# Patient Record
Sex: Female | Born: 1985 | Race: White | Hispanic: No | Marital: Married | Smoking: Never smoker
Health system: Southern US, Community
[De-identification: ages and names within clinical notes are randomized; demographics above are authoritative.]

## PROBLEM LIST (undated history)

## (undated) DIAGNOSIS — J45909 Unspecified asthma, uncomplicated: Secondary | ICD-10-CM

## (undated) DIAGNOSIS — K759 Inflammatory liver disease, unspecified: Secondary | ICD-10-CM

## (undated) DIAGNOSIS — F32A Depression, unspecified: Secondary | ICD-10-CM

## (undated) DIAGNOSIS — F419 Anxiety disorder, unspecified: Secondary | ICD-10-CM

## (undated) DIAGNOSIS — R87629 Unspecified abnormal cytological findings in specimens from vagina: Secondary | ICD-10-CM

## (undated) HISTORY — DX: Unspecified asthma, uncomplicated: J45.909

## (undated) HISTORY — DX: Anxiety disorder, unspecified: F41.9

## (undated) HISTORY — DX: Unspecified abnormal cytological findings in specimens from vagina: R87.629

## (undated) HISTORY — PX: WISDOM TOOTH EXTRACTION: SHX21

## (undated) HISTORY — DX: Inflammatory liver disease, unspecified: K75.9

---

## 2016-04-19 ENCOUNTER — Other Ambulatory Visit (HOSPITAL_COMMUNITY): Payer: Self-pay | Admitting: Nurse Practitioner

## 2016-04-19 DIAGNOSIS — B182 Chronic viral hepatitis C: Secondary | ICD-10-CM

## 2016-05-08 ENCOUNTER — Ambulatory Visit (HOSPITAL_COMMUNITY)
Admission: RE | Admit: 2016-05-08 | Discharge: 2016-05-08 | Disposition: A | Discharge status: Home / Self Care | Payer: BC Managed Care – PPO | Source: Ambulatory Visit | Attending: Nurse Practitioner | Admitting: Nurse Practitioner

## 2016-05-08 DIAGNOSIS — K824 Cholesterolosis of gallbladder: Secondary | ICD-10-CM | POA: Diagnosis not present

## 2016-05-08 DIAGNOSIS — B182 Chronic viral hepatitis C: Secondary | ICD-10-CM | POA: Diagnosis present

## 2016-09-25 ENCOUNTER — Other Ambulatory Visit (HOSPITAL_COMMUNITY): Payer: Self-pay | Admitting: Nurse Practitioner

## 2016-09-25 DIAGNOSIS — B182 Chronic viral hepatitis C: Secondary | ICD-10-CM

## 2016-12-11 ENCOUNTER — Ambulatory Visit (HOSPITAL_COMMUNITY)
Admission: RE | Admit: 2016-12-11 | Discharge: 2016-12-11 | Disposition: A | Discharge status: Home / Self Care | Payer: BC Managed Care – PPO | Source: Ambulatory Visit | Attending: Nurse Practitioner | Admitting: Nurse Practitioner

## 2016-12-11 DIAGNOSIS — B182 Chronic viral hepatitis C: Secondary | ICD-10-CM | POA: Diagnosis not present

## 2017-09-24 ENCOUNTER — Other Ambulatory Visit: Payer: Self-pay | Admitting: Nurse Practitioner

## 2017-09-24 DIAGNOSIS — B1920 Unspecified viral hepatitis C without hepatic coma: Secondary | ICD-10-CM

## 2017-10-28 LAB — OB RESULTS CONSOLE HIV ANTIBODY (ROUTINE TESTING): HIV: NONREACTIVE

## 2017-10-28 LAB — OB RESULTS CONSOLE GC/CHLAMYDIA
Chlamydia: NEGATIVE
Gonorrhea: NEGATIVE

## 2017-10-28 LAB — OB RESULTS CONSOLE HEPATITIS B SURFACE ANTIGEN: Hepatitis B Surface Ag: NEGATIVE

## 2017-10-28 LAB — OB RESULTS CONSOLE RUBELLA ANTIBODY, IGM: Rubella: IMMUNE

## 2017-10-28 LAB — OB RESULTS CONSOLE ANTIBODY SCREEN: Antibody Screen: NEGATIVE

## 2017-10-28 LAB — OB RESULTS CONSOLE ABO/RH: RH Type: POSITIVE

## 2017-10-28 LAB — OB RESULTS CONSOLE RPR: RPR: NONREACTIVE

## 2018-05-06 ENCOUNTER — Encounter (HOSPITAL_COMMUNITY): Payer: Self-pay | Admitting: *Deleted

## 2018-05-07 ENCOUNTER — Telehealth (HOSPITAL_COMMUNITY): Payer: Self-pay | Admitting: *Deleted

## 2018-05-07 ENCOUNTER — Encounter (HOSPITAL_COMMUNITY): Payer: Self-pay

## 2018-05-07 NOTE — Telephone Encounter (Signed)
Preadmission screen  

## 2018-05-07 NOTE — Patient Instructions (Signed)
Lalena Stoltzfoos  05/07/2018   Your procedure is scheduled on:  05/19/2018  Arrive at 0830 at [employer] [facility] on [address] at [facility] and [employer]. You are invited to use the FREE valet parking or use the Visitor's parking deck.  Pick up the phone at the desk and dial [phone].  Call this number if you have problems the morning of surgery: [phone]  Remember:   Do not eat food:(After Midnight) Desps de medianoche.  Do not drink clear liquids: (After Midnight) Desps de medianoche.  Take these medicines the morning of surgery with A SIP OF WATER:  none   Do not wear jewelry, make-up or nail polish.  Do not wear lotions, powders, or perfumes. Do not wear deodorant.  Do not shave 48 hours prior to surgery.  Do not bring valuables to the hospital.  [facility] is not   responsible for any belongings or valuables brought to the hospital.  Contacts, dentures or bridgework may not be worn into surgery.  Leave suitcase in the car. After surgery it may be brought to your room.  For patients admitted to the hospital, checkout time is 11:00 AM the day of              discharge.      Please read over the following fact sheets that you were given:     Preparing for Surgery

## 2018-05-13 ENCOUNTER — Other Ambulatory Visit: Payer: Self-pay | Admitting: Obstetrics and Gynecology

## 2018-05-13 NOTE — H&P (Signed)
33 y.o.  G3P1010 [redacted]w[redacted]d comes in for a repeat cesarean section at term.  Patient has good fetal movement and no bleeding.    No past medical history on file.  Past Surgical History:  Procedure Laterality Date  . CESAREAN SECTION    . WISDOM TOOTH EXTRACTION      OB History  Gravida Para Term Preterm AB Living  3 1 1   1     SAB TAB Ectopic Multiple Live Births  1            # Outcome Date GA Lbr Len/2nd Weight Sex Delivery Anes PTL Lv  3 Current           2 Term 2017 [redacted]w[redacted]d   M CS-LTranv     1 SAB             Social History   Socioeconomic History  . Marital status: Married    Spouse name: Not on file  . Number of children: Not on file  . Years of education: Not on file  . Highest education level: Not on file  Occupational History  . Not on file  Social Needs  . Financial resource strain: Not on file  . Food insecurity:    Worry: Not on file    Inability: Not on file  . Transportation needs:    Medical: Not on file    Non-medical: Not on file  Tobacco Use  . Smoking status: Not on file  Substance and Sexual Activity  . Alcohol use: Not on file  . Drug use: Not on file  . Sexual activity: Not on file  Lifestyle  . Physical activity:    Days per week: Not on file    Minutes per session: Not on file  . Stress: Not on file  Relationships  . Social connections:    Talks on phone: Not on file    Gets together: Not on file    Attends religious service: Not on file    Active member of club or organization: Not on file    Attends meetings of clubs or organizations: Not on file    Relationship status: Not on file  . Intimate partner violence:    Fear of current or ex partner: Not on file    Emotionally abused: Not on file    Physically abused: Not on file    Forced sexual activity: Not on file  Other Topics Concern  . Not on file  Social History Narrative  . Not on file   Patient has no known allergies.   Prenatal Course: uncomplicated.  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   Prenatal Transfer Tool  Maternal Diabetes: No Genetic Screening: Normal Maternal Ultrasounds/Referrals: Normal Fetal Ultrasounds or other Referrals:  None Maternal Substance Abuse:  No Significant Maternal Medications:  None Significant Maternal Lab Results: None  There were no vitals filed for this visit.  Lungs/Cor:  NAD Abdomen:  soft, gravid Ex:  no cords, erythema SVE:  NA FHTs:  present  A/P   For repeat cesarean sectionat term.  All risks, benefits and alternatives discussed with patient and she desires to proceed.  Loney Laurence

## 2018-05-18 ENCOUNTER — Encounter (HOSPITAL_COMMUNITY)
Admission: RE | Admit: 2018-05-18 | Discharge: 2018-05-18 | Disposition: A | Discharge status: Home / Self Care | Payer: BC Managed Care – PPO | Source: Ambulatory Visit

## 2018-05-19 ENCOUNTER — Encounter (HOSPITAL_COMMUNITY): Payer: Self-pay | Admitting: *Deleted

## 2018-05-19 ENCOUNTER — Other Ambulatory Visit: Payer: Self-pay

## 2018-05-19 ENCOUNTER — Inpatient Hospital Stay (HOSPITAL_COMMUNITY)
Admission: AD | Admit: 2018-05-19 | Discharge: 2018-05-21 | DRG: 788 | Disposition: A | Discharge status: Home / Self Care | Payer: BC Managed Care – PPO | Source: Intra-hospital | Attending: Obstetrics and Gynecology | Admitting: Obstetrics and Gynecology

## 2018-05-19 ENCOUNTER — Inpatient Hospital Stay (HOSPITAL_COMMUNITY)
Admission: RE | Admit: 2018-05-19 | Payer: BC Managed Care – PPO | Source: Home / Self Care | Admitting: Obstetrics and Gynecology

## 2018-05-19 ENCOUNTER — Inpatient Hospital Stay (HOSPITAL_COMMUNITY): Payer: BC Managed Care – PPO | Admitting: Certified Registered Nurse Anesthetist

## 2018-05-19 ENCOUNTER — Encounter (HOSPITAL_COMMUNITY)
Admission: AD | Disposition: A | Discharge status: Home / Self Care | Payer: Self-pay | Attending: Obstetrics and Gynecology

## 2018-05-19 DIAGNOSIS — M25511 Pain in right shoulder: Secondary | ICD-10-CM | POA: Diagnosis not present

## 2018-05-19 DIAGNOSIS — Z3A38 38 weeks gestation of pregnancy: Secondary | ICD-10-CM | POA: Diagnosis not present

## 2018-05-19 DIAGNOSIS — O9089 Other complications of the puerperium, not elsewhere classified: Secondary | ICD-10-CM | POA: Diagnosis not present

## 2018-05-19 DIAGNOSIS — O34211 Maternal care for low transverse scar from previous cesarean delivery: Principal | ICD-10-CM | POA: Diagnosis present

## 2018-05-19 LAB — CBC WITH DIFFERENTIAL/PLATELET
Abs Immature Granulocytes: 0.2 10*3/uL — ABNORMAL HIGH (ref 0.00–0.07)
Basophils Absolute: 0 10*3/uL (ref 0.0–0.1)
Basophils Relative: 0 %
Eosinophils Absolute: 0.1 10*3/uL (ref 0.0–0.5)
Eosinophils Relative: 1 %
HCT: 41.5 % (ref 36.0–46.0)
Hemoglobin: 14.1 g/dL (ref 12.0–15.0)
Immature Granulocytes: 2 %
Lymphocytes Relative: 17 %
Lymphs Abs: 1.7 10*3/uL (ref 0.7–4.0)
MCH: 31.5 pg (ref 26.0–34.0)
MCHC: 34 g/dL (ref 30.0–36.0)
MCV: 92.8 fL (ref 80.0–100.0)
Monocytes Absolute: 0.7 10*3/uL (ref 0.1–1.0)
Monocytes Relative: 7 %
Neutro Abs: 7.2 10*3/uL (ref 1.7–7.7)
Neutrophils Relative %: 73 %
Platelets: 204 10*3/uL (ref 150–400)
RBC: 4.47 MIL/uL (ref 3.87–5.11)
RDW: 13.2 % (ref 11.5–15.5)
WBC: 9.9 10*3/uL (ref 4.0–10.5)
nRBC: 0 % (ref 0.0–0.2)

## 2018-05-19 LAB — SYPHILIS: RPR W/REFLEX TO RPR TITER AND TREPONEMAL ANTIBODIES, TRADITIONAL SCREENING AND DIAGNOSIS ALGORITHM: RPR Ser Ql: NONREACTIVE

## 2018-05-19 LAB — TYPE AND SCREEN
ABO/RH(D): A POS
Antibody Screen: NEGATIVE

## 2018-05-19 SURGERY — Surgical Case
Anesthesia: Epidural | Site: Abdomen | Wound class: Clean Contaminated

## 2018-05-19 MED ORDER — NALBUPHINE HCL 10 MG/ML IJ SOLN: 5.0000 mg | INTRAMUSCULAR | Status: DC | PRN

## 2018-05-19 MED ORDER — EPHEDRINE SULFATE-NACL 50-0.9 MG/10ML-% IV SOSY
PREFILLED_SYRINGE | INTRAVENOUS | Status: DC | PRN
  Administered 2018-05-19: 10 mg via INTRAVENOUS

## 2018-05-19 MED ORDER — NALBUPHINE HCL 10 MG/ML IJ SOLN
5.0000 mg | INTRAMUSCULAR | Status: DC | PRN
  Administered 2018-05-19 (×2): 5 mg via INTRAVENOUS
  Filled 2018-05-19 (×2): qty 1

## 2018-05-19 MED ORDER — WITCH HAZEL-GLYCERIN EX PADS: 1.0000 "application " | MEDICATED_PAD | CUTANEOUS | Status: DC | PRN

## 2018-05-19 MED ORDER — DIPHENHYDRAMINE HCL 25 MG PO CAPS: 25.0000 mg | ORAL_CAPSULE | Freq: Four times a day (QID) | ORAL | Status: DC | PRN

## 2018-05-19 MED ORDER — FERROUS SULFATE 325 (65 FE) MG PO TABS
325.0000 mg | ORAL_TABLET | Freq: Two times a day (BID) | ORAL | Status: DC
  Administered 2018-05-19 – 2018-05-21 (×4): 325 mg via ORAL
  Filled 2018-05-19 (×4): qty 1

## 2018-05-19 MED ORDER — NALBUPHINE HCL 10 MG/ML IJ SOLN: 5.0000 mg | Freq: Once | INTRAMUSCULAR | Status: DC | PRN

## 2018-05-19 MED ORDER — ALBUTEROL SULFATE (2.5 MG/3ML) 0.083% IN NEBU: 2.5000 mg | INHALATION_SOLUTION | Freq: Four times a day (QID) | RESPIRATORY_TRACT | Status: DC | PRN

## 2018-05-19 MED ORDER — MENTHOL 3 MG MT LOZG: 1.0000 | LOZENGE | OROMUCOSAL | Status: DC | PRN

## 2018-05-19 MED ORDER — MORPHINE SULFATE (PF) 0.5 MG/ML IJ SOLN
INTRAMUSCULAR | Status: DC | PRN
  Administered 2018-05-19: 150 ug via INTRATHECAL

## 2018-05-19 MED ORDER — KETOROLAC TROMETHAMINE 30 MG/ML IJ SOLN
30.0000 mg | Freq: Once | INTRAMUSCULAR | Status: AC
  Administered 2018-05-19: 12:00:00 30 mg via INTRAVENOUS

## 2018-05-19 MED ORDER — METHYLERGONOVINE MALEATE 0.2 MG/ML IJ SOLN: 0.2000 mg | INTRAMUSCULAR | Status: DC | PRN

## 2018-05-19 MED ORDER — PHENYLEPHRINE HCL-NACL 20-0.9 MG/250ML-% IV SOLN
INTRAVENOUS | Status: AC
  Filled 2018-05-19: qty 250

## 2018-05-19 MED ORDER — SOD CITRATE-CITRIC ACID 500-334 MG/5ML PO SOLN: 30.0000 mL | ORAL | Status: DC

## 2018-05-19 MED ORDER — SODIUM CHLORIDE 0.9 % IR SOLN
Status: DC | PRN
  Administered 2018-05-19: 1

## 2018-05-19 MED ORDER — ONDANSETRON HCL 4 MG/2ML IJ SOLN: 4.0000 mg | Freq: Once | INTRAMUSCULAR | Status: DC | PRN

## 2018-05-19 MED ORDER — DIPHENHYDRAMINE HCL 50 MG/ML IJ SOLN
12.5000 mg | INTRAMUSCULAR | Status: DC | PRN
  Administered 2018-05-19: 13:00:00 12.5 mg via INTRAVENOUS

## 2018-05-19 MED ORDER — SODIUM CHLORIDE 0.9% FLUSH: 3.0000 mL | INTRAVENOUS | Status: DC | PRN

## 2018-05-19 MED ORDER — MORPHINE SULFATE (PF) 0.5 MG/ML IJ SOLN
INTRAMUSCULAR | Status: AC
  Filled 2018-05-19: qty 10

## 2018-05-19 MED ORDER — DIPHENHYDRAMINE HCL 50 MG/ML IJ SOLN
INTRAMUSCULAR | Status: AC
  Filled 2018-05-19: qty 1

## 2018-05-19 MED ORDER — ONDANSETRON HCL 4 MG/2ML IJ SOLN
INTRAMUSCULAR | Status: AC
  Filled 2018-05-19: qty 2

## 2018-05-19 MED ORDER — COCONUT OIL OIL: 1.0000 "application " | TOPICAL_OIL | Status: DC | PRN

## 2018-05-19 MED ORDER — MEPERIDINE HCL 25 MG/ML IJ SOLN: 6.2500 mg | INTRAMUSCULAR | Status: DC | PRN

## 2018-05-19 MED ORDER — OXYCODONE-ACETAMINOPHEN 5-325 MG PO TABS
1.0000 | ORAL_TABLET | ORAL | Status: DC | PRN
  Administered 2018-05-20 (×3): 1 via ORAL
  Filled 2018-05-19 (×4): qty 1

## 2018-05-19 MED ORDER — IBUPROFEN 800 MG PO TABS
800.0000 mg | ORAL_TABLET | Freq: Three times a day (TID) | ORAL | Status: DC
  Administered 2018-05-19 – 2018-05-21 (×7): 800 mg via ORAL
  Filled 2018-05-19 (×7): qty 1

## 2018-05-19 MED ORDER — LACTATED RINGERS IV SOLN
INTRAVENOUS | Status: DC
  Administered 2018-05-19 – 2018-05-20 (×2): via INTRAVENOUS

## 2018-05-19 MED ORDER — LACTATED RINGERS IV SOLN
INTRAVENOUS | Status: DC
  Administered 2018-05-19 (×3): via INTRAVENOUS

## 2018-05-19 MED ORDER — LACTATED RINGERS IV SOLN: INTRAVENOUS | Status: DC

## 2018-05-19 MED ORDER — SOD CITRATE-CITRIC ACID 500-334 MG/5ML PO SOLN
ORAL | Status: AC
  Filled 2018-05-19: qty 30

## 2018-05-19 MED ORDER — BUPIVACAINE IN DEXTROSE 0.75-8.25 % IT SOLN
INTRATHECAL | Status: DC | PRN
  Administered 2018-05-19: 1.6 mL via INTRATHECAL

## 2018-05-19 MED ORDER — TETANUS-DIPHTH-ACELL PERTUSSIS 5-2.5-18.5 LF-MCG/0.5 IM SUSP: 0.5000 mL | Freq: Once | INTRAMUSCULAR | Status: DC

## 2018-05-19 MED ORDER — ZOLPIDEM TARTRATE 5 MG PO TABS: 5.0000 mg | ORAL_TABLET | Freq: Every evening | ORAL | Status: DC | PRN

## 2018-05-19 MED ORDER — SCOPOLAMINE 1 MG/3DAYS TD PT72
MEDICATED_PATCH | TRANSDERMAL | Status: AC
  Filled 2018-05-19: qty 1

## 2018-05-19 MED ORDER — ACETAMINOPHEN 500 MG PO TABS: 1000.0000 mg | ORAL_TABLET | ORAL | Status: DC

## 2018-05-19 MED ORDER — OXYTOCIN 40 UNITS IN NORMAL SALINE INFUSION - SIMPLE MED: 2.5000 [IU]/h | INTRAVENOUS | Status: AC

## 2018-05-19 MED ORDER — SCOPOLAMINE 1 MG/3DAYS TD PT72
1.0000 | MEDICATED_PATCH | Freq: Once | TRANSDERMAL | Status: DC
  Administered 2018-05-19: 1.5 mg via TRANSDERMAL

## 2018-05-19 MED ORDER — FENTANYL CITRATE (PF) 100 MCG/2ML IJ SOLN
INTRAMUSCULAR | Status: DC | PRN
  Administered 2018-05-19: 15 ug via INTRATHECAL

## 2018-05-19 MED ORDER — CEFAZOLIN SODIUM-DEXTROSE 2-4 GM/100ML-% IV SOLN
INTRAVENOUS | Status: AC
  Filled 2018-05-19: qty 100

## 2018-05-19 MED ORDER — SODIUM CHLORIDE 0.9 % IV SOLN
INTRAVENOUS | Status: DC | PRN
  Administered 2018-05-19: 60 ug/min via INTRAVENOUS

## 2018-05-19 MED ORDER — METHYLERGONOVINE MALEATE 0.2 MG PO TABS: 0.2000 mg | ORAL_TABLET | ORAL | Status: DC | PRN

## 2018-05-19 MED ORDER — STERILE WATER FOR IRRIGATION IR SOLN
Status: DC | PRN
  Administered 2018-05-19: 1000 mL

## 2018-05-19 MED ORDER — OXYTOCIN 40 UNITS IN NORMAL SALINE INFUSION - SIMPLE MED
INTRAVENOUS | Status: AC
  Filled 2018-05-19: qty 1000

## 2018-05-19 MED ORDER — NALOXONE HCL 0.4 MG/ML IJ SOLN: 0.4000 mg | INTRAMUSCULAR | Status: DC | PRN

## 2018-05-19 MED ORDER — KETOROLAC TROMETHAMINE 30 MG/ML IJ SOLN
INTRAMUSCULAR | Status: AC
  Filled 2018-05-19: qty 1

## 2018-05-19 MED ORDER — OXYTOCIN 40 UNITS IN NORMAL SALINE INFUSION - SIMPLE MED
INTRAVENOUS | Status: DC | PRN
  Administered 2018-05-19: 40 mL via INTRAVENOUS

## 2018-05-19 MED ORDER — NALOXONE HCL 4 MG/10ML IJ SOLN
1.0000 ug/kg/h | INTRAVENOUS | Status: DC | PRN
  Filled 2018-05-19: qty 5

## 2018-05-19 MED ORDER — CEFAZOLIN SODIUM-DEXTROSE 2-4 GM/100ML-% IV SOLN: 2.0000 g | INTRAVENOUS | Status: DC

## 2018-05-19 MED ORDER — FENTANYL CITRATE (PF) 100 MCG/2ML IJ SOLN
INTRAMUSCULAR | Status: AC
  Filled 2018-05-19: qty 2

## 2018-05-19 MED ORDER — FLEET ENEMA 7-19 GM/118ML RE ENEM: 1.0000 | ENEMA | Freq: Every day | RECTAL | Status: DC | PRN

## 2018-05-19 MED ORDER — ONDANSETRON HCL 4 MG/2ML IJ SOLN
INTRAMUSCULAR | Status: DC | PRN
  Administered 2018-05-19: 4 mg via INTRAVENOUS

## 2018-05-19 MED ORDER — SENNOSIDES-DOCUSATE SODIUM 8.6-50 MG PO TABS
2.0000 | ORAL_TABLET | ORAL | Status: DC
  Administered 2018-05-19: 22:00:00 2 via ORAL
  Filled 2018-05-19: qty 2

## 2018-05-19 MED ORDER — OXYCODONE HCL 5 MG/5ML PO SOLN: 5.0000 mg | Freq: Once | ORAL | Status: DC | PRN

## 2018-05-19 MED ORDER — OXYCODONE HCL 5 MG PO TABS: 5.0000 mg | ORAL_TABLET | Freq: Once | ORAL | Status: DC | PRN

## 2018-05-19 MED ORDER — PRENATAL MULTIVITAMIN CH
1.0000 | ORAL_TABLET | Freq: Every day | ORAL | Status: DC
  Administered 2018-05-20: 23:00:00 1 via ORAL
  Filled 2018-05-19 (×2): qty 1

## 2018-05-19 MED ORDER — DIPHENHYDRAMINE HCL 25 MG PO CAPS: 25.0000 mg | ORAL_CAPSULE | ORAL | Status: DC | PRN

## 2018-05-19 MED ORDER — MEASLES, MUMPS & RUBELLA VAC IJ SOLR: 0.5000 mL | Freq: Once | INTRAMUSCULAR | Status: DC

## 2018-05-19 MED ORDER — DIBUCAINE (PERIANAL) 1 % EX OINT: 1.0000 "application " | TOPICAL_OINTMENT | CUTANEOUS | Status: DC | PRN

## 2018-05-19 MED ORDER — SIMETHICONE 80 MG PO CHEW
80.0000 mg | CHEWABLE_TABLET | Freq: Three times a day (TID) | ORAL | Status: DC
  Administered 2018-05-19 – 2018-05-21 (×6): 80 mg via ORAL
  Filled 2018-05-19 (×6): qty 1

## 2018-05-19 MED ORDER — ACETAMINOPHEN 500 MG PO TABS
1000.0000 mg | ORAL_TABLET | ORAL | Status: AC
  Administered 2018-05-19: 10:00:00 1000 mg via ORAL

## 2018-05-19 MED ORDER — FENTANYL CITRATE (PF) 100 MCG/2ML IJ SOLN: 25.0000 ug | INTRAMUSCULAR | Status: DC | PRN

## 2018-05-19 MED ORDER — BISACODYL 10 MG RE SUPP: 10.0000 mg | Freq: Every day | RECTAL | Status: DC | PRN

## 2018-05-19 MED ORDER — ONDANSETRON HCL 4 MG/2ML IJ SOLN: 4.0000 mg | Freq: Three times a day (TID) | INTRAMUSCULAR | Status: DC | PRN

## 2018-05-19 MED ORDER — SIMETHICONE 80 MG PO CHEW: 80.0000 mg | CHEWABLE_TABLET | ORAL | Status: DC | PRN

## 2018-05-19 MED ORDER — EPHEDRINE 5 MG/ML INJ
INTRAVENOUS | Status: AC
  Filled 2018-05-19: qty 10

## 2018-05-19 MED ORDER — SODIUM CHLORIDE 0.9 % IV SOLN
INTRAVENOUS | Status: DC | PRN
  Administered 2018-05-19: 11:00:00 via INTRAVENOUS

## 2018-05-19 MED ORDER — SIMETHICONE 80 MG PO CHEW
80.0000 mg | CHEWABLE_TABLET | ORAL | Status: DC
  Administered 2018-05-19 – 2018-05-20 (×2): 80 mg via ORAL
  Filled 2018-05-19 (×2): qty 1

## 2018-05-19 MED ORDER — CEFAZOLIN SODIUM-DEXTROSE 2-4 GM/100ML-% IV SOLN
2.0000 g | Freq: Once | INTRAVENOUS | Status: AC
  Administered 2018-05-19: 2 g via INTRAVENOUS

## 2018-05-19 MED ORDER — ACETAMINOPHEN 500 MG PO TABS
ORAL_TABLET | ORAL | Status: AC
  Filled 2018-05-19: qty 2

## 2018-05-19 MED ORDER — SOD CITRATE-CITRIC ACID 500-334 MG/5ML PO SOLN
30.0000 mL | Freq: Once | ORAL | Status: AC
  Administered 2018-05-19: 30 mL via ORAL

## 2018-05-19 SURGICAL SUPPLY — 33 items
BENZOIN TINCTURE PRP APPL 2/3 (GAUZE/BANDAGES/DRESSINGS) ×3 IMPLANT
CHLORAPREP W/TINT 26ML (MISCELLANEOUS) ×3 IMPLANT
CLAMP CORD UMBIL (MISCELLANEOUS) IMPLANT
CLEANER TIP ELECTROSURG 2X2 (MISCELLANEOUS) ×3 IMPLANT
CLOSURE WOUND 1/2 X4 (GAUZE/BANDAGES/DRESSINGS) ×1
CLOTH BEACON ORANGE TIMEOUT ST (SAFETY) ×3 IMPLANT
DRSG OPSITE POSTOP 4X10 (GAUZE/BANDAGES/DRESSINGS) ×3 IMPLANT
ELECT REM PT RETURN 9FT ADLT (ELECTROSURGICAL) ×3
ELECTRODE REM PT RTRN 9FT ADLT (ELECTROSURGICAL) ×1 IMPLANT
EXTRACTOR VACUUM BELL STYLE (SUCTIONS) IMPLANT
GLOVE BIO SURGEON STRL SZ7 (GLOVE) ×3 IMPLANT
GLOVE BIOGEL PI IND STRL 7.0 (GLOVE) ×1 IMPLANT
GLOVE BIOGEL PI INDICATOR 7.0 (GLOVE) ×2
GOWN STRL REUS W/TWL LRG LVL3 (GOWN DISPOSABLE) ×6 IMPLANT
KIT ABG SYR 3ML LUER SLIP (SYRINGE) IMPLANT
NEEDLE HYPO 25X5/8 SAFETYGLIDE (NEEDLE) IMPLANT
NS IRRIG 1000ML POUR BTL (IV SOLUTION) ×3 IMPLANT
PACK C SECTION WH (CUSTOM PROCEDURE TRAY) ×3 IMPLANT
PAD OB MATERNITY 4.3X12.25 (PERSONAL CARE ITEMS) ×3 IMPLANT
PENCIL SMOKE EVAC W/HOLSTER (ELECTROSURGICAL) ×3 IMPLANT
RTRCTR C-SECT PINK 25CM LRG (MISCELLANEOUS) ×3 IMPLANT
STRIP CLOSURE SKIN 1/2X4 (GAUZE/BANDAGES/DRESSINGS) ×2 IMPLANT
SUT MNCRL 0 VIOLET CTX 36 (SUTURE) ×2 IMPLANT
SUT MONOCRYL 0 CTX 36 (SUTURE) ×4
SUT PLAIN 2 0 XLH (SUTURE) IMPLANT
SUT VIC AB 0 CT1 27 (SUTURE) ×6
SUT VIC AB 0 CT1 27XBRD ANBCTR (SUTURE) ×3 IMPLANT
SUT VIC AB 2-0 CT1 27 (SUTURE) ×2
SUT VIC AB 2-0 CT1 TAPERPNT 27 (SUTURE) ×1 IMPLANT
SUT VIC AB 4-0 KS 27 (SUTURE) ×3 IMPLANT
TOWEL OR 17X24 6PK STRL BLUE (TOWEL DISPOSABLE) ×3 IMPLANT
TRAY FOLEY W/BAG SLVR 14FR LF (SET/KITS/TRAYS/PACK) ×3 IMPLANT
WATER STERILE IRR 1000ML POUR (IV SOLUTION) ×3 IMPLANT

## 2018-05-19 NOTE — Op Note (Signed)
PATIENT:  Yesenia Mora  33 y.o. female  PRE-OPERATIVE DIAGNOSIS:  REPEAT EDD: 05/25/18  POST-OPERATIVE DIAGNOSIS:  REPEAT EDD: 05/25/18  PROCEDURE:  Procedure(s): CESAREAN SECTION (N/A)  SURGEON:  Surgeon(s) and Role:    Carrington Clamp, MD - Primary  ASSISTANTS: RNFA   ANESTHESIA:   spinal  EBL:  70 mL   SPECIMEN:  No Specimen  DISPOSITION OF SPECIMEN:  N/A  COUNTS:  YES  TOURNIQUET:  * No tourniquets in log *  DICTATION: .Note written in EPIC  PLAN OF CARE: Admit to inpatient   PATIENT DISPOSITION:  PACU - hemodynamically stable.   Delay start of Pharmacological VTE agent (>24hrs) due to surgical blood loss or risk of bleeding: not applicable  Complications:  None  Medications:  Ancef, Pitocin Findings:  Baby female, Apgars 8,9, weight P.   Normal tubes, ovaries and uterus seen.  Baby was skin to skin with mother after birth in the OR.  Technique:  After adequate spinal anesthesia was achieved, the patient was prepped and draped in usual sterile fashion.  A foley catheter was used to drain the bladder.  A pfannanstiel incision was made with the scalpel and carried down to the fascia with the bovie cautery. The fascia was incised in the midline with the scalpel and carried in a transverse curvilinear manner bilaterally.  The fascia was reflected superiorly and inferiorly off the rectus muscles and the muscles split in the midline.  A bowel free portion of the peritoneum was entered bluntly and then extended in a superior and inferior manner with good visualization of the bowel and bladder.  The Alexis instrument was then placed and the vesico-uterine fascia tented up and incised in a transverse curvilinear manner.  A 2 cm transverse incision was made in the upper portion of the lower uterine segment until the amnion was exposed.   The incision was extended transversely in a blunt manner.  Clear fluid was noted and the baby delivered in the vertex presentation without  complication.  The baby was bulb suctioned and the cord was clamped and cut aftet stripping blood from cord into baby.  The baby was then handed to awaiting Neonatology.  The placenta was then delivered manually and the uterus cleared of all debris.  The uterine incision was then closed with a running lock stitch of 0 monocryl.  An imbricating layer of 0 monocryl was closed as well. Excellent hemostasis of the uterine incision was achieved and the abdomen was cleared with irrigation.  The peritoneum was closed with a running stitch of 2-0 vicryl.  This incorporated the rectus muscles as a separate layer.  The fascia was then closed with a running stitch of 0 vicryl.  The subcutaneous layer was closed with interrupted  stitches of 2-0 plain gut.  The skin was closed with 4-0 vicryl on a Keith needle and steri-strips.  The patient tolerated the procedure well and was returned to the recovery room in stable condition.  All counts were correct times three.  Loney Laurence

## 2018-05-19 NOTE — Lactation Note (Signed)
This note was copied from a baby's chart. Lactation Consultation Note  Patient Name: Yesenia Mora YSHUO'H Date: 05/19/2018 Reason for consult: Initial assessment;Term  2 hours old FT female who is being exclusively BF by his mother, she's a P2 and experienced BF. She was able to BF her first child for 6 weeks and the reason why she had to stop early is because baby had acid reflux. Mom is familiar with hand expression, and already able to see some drops of colostrum when doing so. She has a Medela DEBP at home.  Offered assistance with latch but mom politely declined, she said baby just finished a 45 minutes feeding, he's been on and off the breast almost the entire time since he was born. Mom was very sleepy, asked her RN Dot Lanes to hold off admission paperwork until mom wakes up from her nap. Reviewed feeding cues, cluster feeding and normal newborn behavior with dad mostly, mom was falling asleep by then.  Feeding plan:  1. Encouraged mom to keep feeding baby 8-12 times/24 hours or sooner if feeding cues are present 2. Hand expression and spoon feeding were also encouraged  BF brochure, BF resources and feeding diary were reviewed. Parents reported all questions and concerns were answered, they're both aware of LC services and will call PRN.  Maternal Data Formula Feeding for Exclusion: No Has patient been taught Hand Expression?: Yes Does the patient have breastfeeding experience prior to this delivery?: Yes  Feeding Feeding Type: Breast Fed  LATCH Score Latch: Grasps breast easily, tongue down, lips flanged, rhythmical sucking.  Audible Swallowing: A few with stimulation  Type of Nipple: Everted at rest and after stimulation  Comfort (Breast/Nipple): Soft / non-tender  Hold (Positioning): Assistance needed to correctly position infant at breast and maintain latch.  LATCH Score: 8  Interventions Interventions: Breast feeding basics reviewed  Lactation Tools  Discussed/Used WIC Program: No   Consult Status Consult Status: Follow-up Date: 05/20/18 Follow-up type: In-patient    Yesenia Mora Yesenia Mora 05/19/2018, 2:03 PM

## 2018-05-19 NOTE — Anesthesia Preprocedure Evaluation (Signed)
Anesthesia Evaluation  Patient identified by MRN, date of birth, ID band Patient awake    Reviewed: Allergy & Precautions, H&P , NPO status , Patient's Chart, lab work & pertinent test results  History of Anesthesia Complications Negative for: history of anesthetic complications  Airway Mallampati: II  TM Distance: >3 FB Neck ROM: full    Dental no notable dental hx.    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) ,    Pulmonary exam normal        Cardiovascular negative cardio ROS Normal cardiovascular exam Rhythm:regular Rate:Normal     Neuro/Psych negative neurological ROS  negative psych ROS   GI/Hepatic negative GI ROS, Neg liver ROS,   Endo/Other  negative endocrine ROS  Renal/GU negative Renal ROS  negative genitourinary   Musculoskeletal   Abdominal   Peds  Hematology negative hematology ROS (+)   Anesthesia Other Findings   Reproductive/Obstetrics (+) Pregnancy                             Anesthesia Physical Anesthesia Plan  ASA: II  Anesthesia Plan: Epidural   Post-op Pain Management:    Induction:   PONV Risk Score and Plan:   Airway Management Planned:   Additional Equipment:   Intra-op Plan:   Post-operative Plan:   Informed Consent: I have reviewed the patients History and Physical, chart, labs and discussed the procedure including the risks, benefits and alternatives for the proposed anesthesia with the patient or authorized representative who has indicated his/her understanding and acceptance.       Plan Discussed with:   Anesthesia Plan Comments:         Anesthesia Quick Evaluation

## 2018-05-19 NOTE — Transfer of Care (Signed)
Immediate Anesthesia Transfer of Care Note  Patient: Yesenia Mora  Procedure(s) Performed: CESAREAN SECTION (N/A Abdomen)  Patient Location: PACU  Anesthesia Type:Spinal  Level of Consciousness: awake, alert  and oriented  Airway & Oxygen Therapy: Patient Spontanous Breathing  Post-op Assessment: Report given to RN and Post -op Vital signs reviewed and stable  Post vital signs: Reviewed and stable  Last Vitals:  Vitals Value Taken Time  BP 125/66 05/19/2018 11:52 AM  Temp    Pulse 87 05/19/2018 11:53 AM  Resp 20 05/19/2018 11:53 AM  SpO2 98 % 05/19/2018 11:53 AM  Vitals shown include unvalidated device data.  Last Pain:  Vitals:   05/19/18 0837  TempSrc: Oral         Complications: No apparent anesthesia complications

## 2018-05-19 NOTE — Progress Notes (Signed)
There has been no change in the patients history, status or exam since the history and physical.  Vitals:   05/19/18 0837  BP: 111/74  Pulse: 86  Resp: 18  Temp: 97.8 F (36.6 C)  TempSrc: Oral  SpO2: 100%  Weight: 69.4 kg  Height: 5\' 2"  (1.575 m)    Results for orders placed or performed during the hospital encounter of 05/19/18 (from the past 72 hour(s))  Type and screen [facility]     Status: None   Collection Time: 05/19/18  8:40 AM  Result Value Ref Range   ABO/RH(D) A POS    Antibody Screen NEG    Sample Expiration      05/22/2018 Performed at [facility], [address], [place], [place] [address]   CBC with Differential/Platelet     Status: Abnormal   Collection Time: 05/19/18  8:47 AM  Result Value Ref Range   WBC 9.9 4.0 - 10.5 K/uL   RBC 4.47 3.87 - 5.11 MIL/uL   Hemoglobin 14.1 12.0 - 15.0 g/dL   HCT 23.5 57.3 - 22.0 %   MCV 92.8 80.0 - 100.0 fL   MCH 31.5 26.0 - 34.0 pg   MCHC 34.0 30.0 - 36.0 g/dL   RDW 25.4 27.0 - 62.3 %   Platelets 204 150 - 400 K/uL   nRBC 0.0 0.0 - 0.2 %   Neutrophils Relative % 73 %   Neutro Abs 7.2 1.7 - 7.7 K/uL   Lymphocytes Relative 17 %   Lymphs Abs 1.7 0.7 - 4.0 K/uL   Monocytes Relative 7 %   Monocytes Absolute 0.7 0.1 - 1.0 K/uL   Eosinophils Relative 1 %   Eosinophils Absolute 0.1 0.0 - 0.5 K/uL   Basophils Relative 0 %   Basophils Absolute 0.0 0.0 - 0.1 K/uL   Immature Granulocytes 2 %   Abs Immature Granulocytes 0.20 (H) 0.00 - 0.07 K/uL    Comment: Performed at [facility], [address] [place], [place] [address]    Loney Laurence

## 2018-05-19 NOTE — Anesthesia Procedure Notes (Signed)
Spinal  Patient location during procedure: OR Staffing Anesthesiologist: Tabitha Tupper E, MD Performed: anesthesiologist  Preanesthetic Checklist Completed: patient identified, surgical consent, pre-op evaluation, timeout performed, IV checked, risks and benefits discussed and monitors and equipment checked Spinal Block Patient position: sitting Prep: site prepped and draped and DuraPrep Patient monitoring: continuous pulse ox, blood pressure and heart rate Approach: midline Location: L3-4 Injection technique: single-shot Needle Needle type: Pencan  Needle gauge: 24 G Needle length: 9 cm Additional Notes Functioning IV was confirmed and monitors were applied. Sterile prep and drape, including hand hygiene and sterile gloves were used. The patient was positioned and the spine was prepped. The skin was anesthetized with lidocaine.  Free flow of clear CSF was obtained prior to injecting local anesthetic into the CSF. The needle was carefully withdrawn. The patient tolerated the procedure well.      

## 2018-05-19 NOTE — Brief Op Note (Signed)
05/19/2018  11:38 AM  PATIENT:  Yesenia Mora  33 y.o. female  PRE-OPERATIVE DIAGNOSIS:  REPEAT EDD: 05/25/18  POST-OPERATIVE DIAGNOSIS:  REPEAT EDD: 05/25/18  PROCEDURE:  Procedure(s): CESAREAN SECTION (N/A)  SURGEON:  Surgeon(s) and Role:    Carrington Clamp, MD - Primary  ASSISTANTS: RNFA   ANESTHESIA:   spinal  EBL:  70 mL   SPECIMEN:  No Specimen  DISPOSITION OF SPECIMEN:  N/A  COUNTS:  YES  TOURNIQUET:  * No tourniquets in log *  DICTATION: .Note written in EPIC  PLAN OF CARE: Admit to inpatient   PATIENT DISPOSITION:  PACU - hemodynamically stable.   Delay start of Pharmacological VTE agent (>24hrs) due to surgical blood loss or risk of bleeding: not applicable

## 2018-05-20 LAB — CBC
HCT: 37.4 % (ref 36.0–46.0)
Hemoglobin: 12.6 g/dL (ref 12.0–15.0)
MCH: 31.4 pg (ref 26.0–34.0)
MCHC: 33.7 g/dL (ref 30.0–36.0)
MCV: 93.3 fL (ref 80.0–100.0)
Platelets: 176 10*3/uL (ref 150–400)
RBC: 4.01 MIL/uL (ref 3.87–5.11)
RDW: 13.3 % (ref 11.5–15.5)
WBC: 11.6 10*3/uL — ABNORMAL HIGH (ref 4.0–10.5)
nRBC: 0 % (ref 0.0–0.2)

## 2018-05-20 LAB — ABO/RH: ABO/RH(D): A POS

## 2018-05-20 MED ORDER — PNEUMOCOCCAL VAC POLYVALENT 25 MCG/0.5ML IJ INJ: 0.5000 mL | INJECTION | Freq: Once | INTRAMUSCULAR | Status: DC

## 2018-05-20 NOTE — Lactation Note (Signed)
This note was copied from a baby's chart. Lactation Consultation Note  Patient Name: Yesenia Mora VOPFY'T Date: 05/20/2018 Reason for consult: Follow-up assessment;Term  F/U with P2 mom, baby is now 20 hours old. Mom is experienced with breastfeeding and reports feeding is going well. Mom denies any pain/discomfort or difficulty with latching. Mom states baby has been a little sleepy today since circumcision.  Baby sleeping soundly in bassinet, mom states she will feed him soon.  Reviewed hand expression and breast massage prior to latching baby.  Reviewed expected output of baby and normal baby feeding behaviors.  Reviewed alternating breasts each feeding and utilizing various feeding positions.  Maternal Data Has patient been taught Hand Expression?: Yes(demonstrated) Does the patient have breastfeeding experience prior to this delivery?: Yes   Interventions Interventions: Breast massage;Hand express;Position options  Consult Status Consult Status: Follow-up Date: 05/21/18 Follow-up type: In-patient    Virgia Land 05/20/2018, 6:03 PM

## 2018-05-20 NOTE — Anesthesia Postprocedure Evaluation (Signed)
Anesthesia Post Note  Patient: Chartered certified accountant  Procedure(s) Performed: CESAREAN SECTION (N/A Abdomen)     Patient location during evaluation: PACU Anesthesia Type: Epidural Level of consciousness: oriented and awake and alert Pain management: pain level controlled Vital Signs Assessment: post-procedure vital signs reviewed and stable Respiratory status: spontaneous breathing, respiratory function stable and nonlabored ventilation Cardiovascular status: blood pressure returned to baseline and stable Postop Assessment: no headache, no backache, no apparent nausea or vomiting and epidural receding Anesthetic complications: no    Last Vitals:  Vitals:   05/20/18 1022 05/20/18 1314  BP: (!) 98/50 (!) 102/56  Pulse: 63 73  Resp: 20 19  Temp: 36.8 C (!) 36.3 C  SpO2:  98%    Last Pain:  Vitals:   05/20/18 1600  TempSrc:   PainSc: 5    Pain Goal: Patients Stated Pain Goal: 4 (05/19/18 1825)                 Lucretia Kern

## 2018-05-20 NOTE — Progress Notes (Signed)
Patient is eating, ambulating, voiding.  Pain control is good.  Appropriate lochia, no complaints but some right shoulder pain.  Vitals:   05/19/18 2219 05/20/18 0230 05/20/18 0520 05/20/18 1022  BP: (!) 90/52 (!) 92/49 (!) 97/58 (!) 98/50  Pulse: 64 (!) 59 65 63  Resp: 16 18 18 20   Temp: 97.8 F (36.6 C) 97.6 F (36.4 C) 97.6 F (36.4 C) 98.3 F (36.8 C)  TempSrc: Oral Oral Oral Axillary  SpO2: 96%     Weight:      Height:        Fundus firm, non-tender Inc: c/d/i Ext: no calf tenderness   Lab Results  Component Value Date   WBC 11.6 (H) 05/20/2018   HGB 12.6 05/20/2018   HCT 37.4 05/20/2018   MCV 93.3 05/20/2018   PLT 176 05/20/2018    --/--/A POS, A POS (04/14 0840)  A/P Post op day #1 s/p repeat c/s circ desired, consent obtained  Routine care.  Expect d/c 4/16.    Philip Aspen

## 2018-05-21 ENCOUNTER — Encounter (HOSPITAL_COMMUNITY): Payer: Self-pay | Admitting: *Deleted

## 2018-05-21 MED ORDER — OXYCODONE-ACETAMINOPHEN 5-325 MG PO TABS: 1.0000 | ORAL_TABLET | ORAL | 0 refills | Status: DC | PRN

## 2018-05-21 NOTE — Progress Notes (Signed)
  Patient is eating, ambulating, voiding.  Pain control is good.  Vitals:   05/20/18 0520 05/20/18 1022 05/20/18 1314 05/21/18 0603  BP: (!) 97/58 (!) 98/50 (!) 102/56 108/60  Pulse: 65 63 73 67  Resp: 18 20 19 18   Temp: 97.6 F (36.4 C) 98.3 F (36.8 C) (!) 97.3 F (36.3 C) 98.5 F (36.9 C)  TempSrc: Oral Axillary Oral Oral  SpO2:   98%   Weight:      Height:        lungs:   clear to auscultation cor:    RRR Abdomen:  soft, appropriate tenderness, incisions intact and without erythema or exudate ex:    no cords   Lab Results  Component Value Date   WBC 11.6 (H) 05/20/2018   HGB 12.6 05/20/2018   HCT 37.4 05/20/2018   MCV 93.3 05/20/2018   PLT 176 05/20/2018    --/--/A POS, A POS (04/14 0840)/RI  A/P    Post operative day 2.  Routine post op and postpartum care.  Expect d/c today.  Percocet for pain control.

## 2018-05-21 NOTE — Discharge Summary (Signed)
Obstetric Discharge Summary Reason for Admission: cesarean section Prenatal Procedures: none Intrapartum Procedures: cesarean: low cervical, transverse Postpartum Procedures: none Complications-Operative and Postpartum: none Hemoglobin  Date Value Ref Range Status  05/20/2018 12.6 12.0 - 15.0 g/dL Final   HCT  Date Value Ref Range Status  05/20/2018 37.4 36.0 - 46.0 % Final    Discharge Diagnoses: Term Pregnancy-delivered  Discharge Information: Date: 05/21/2018 Activity: pelvic rest Diet: routine Medications: Percocet Condition: stable Instructions: refer to practice specific booklet Discharge to: home Follow-up Information    Carrington Clamp, MD Follow up in 4 week(s).   Specialty:  Obstetrics and Gynecology Why:  May be telehealth if still under restrictions. Contact information: [address] [place] [address] [phone]           Newborn Data: Live born female  Birth Weight: 7 lb 2.1 oz (3235 g) APGAR: 8, 9  Newborn Delivery   Birth date/time:  05/19/2018 11:09:00 Delivery type:  C-Section, Low Transverse Trial of labor:  No C-section categorization:  Repeat     Home with mother.  Loney Laurence 05/21/2018, 8:22 AM

## 2018-05-21 NOTE — Lactation Note (Signed)
This note was copied from a baby's chart. Lactation Consultation Note  Patient Name: Yesenia Mora'O Date: 05/21/2018 Reason for consult: Follow-up assessment;Term Baby is 48 hours old/7% weight loss.  Mom reports baby is feeding well.  Her breasts are feeling heavy.  Discussed milk coming to volume and the prevention and treatment of engorgement.  Mom has a pump at home.  Lactation outpatient services and support reviewed and encouraged.  Maternal Data    Feeding Feeding Type: Breast Fed  LATCH Score                   Interventions    Lactation Tools Discussed/Used     Consult Status Consult Status: Complete Follow-up type: Call as needed    Huston Foley 05/21/2018, 11:39 AM

## 2018-05-22 IMAGING — US US ABDOMEN COMPLETE W/ ELASTOGRAPHY
1 series · 13 of 15 positions shown · non-contrast
Comparison: None.

CLINICAL DATA: Colonic hepatitis-C without hepatic coma.



[Series 1: us abdomen complete w/ elastography · 0.14mm/px · 13 of 15 slices shown]
[im 1/15]
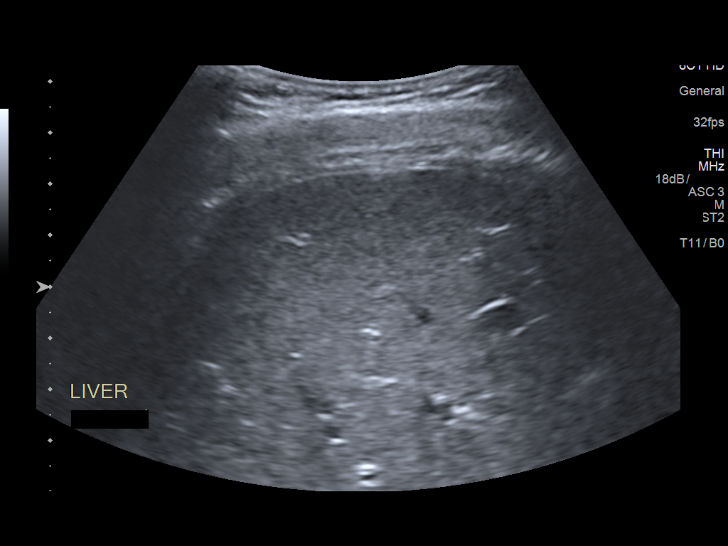
[im 2/15]
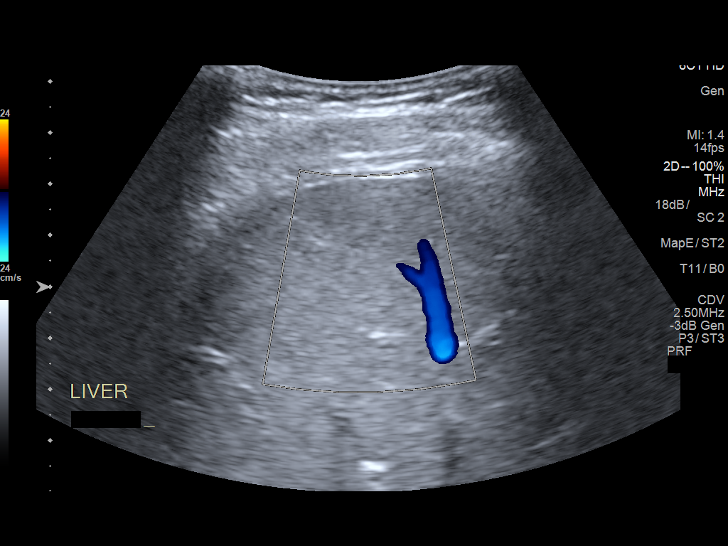
[im 3/15]
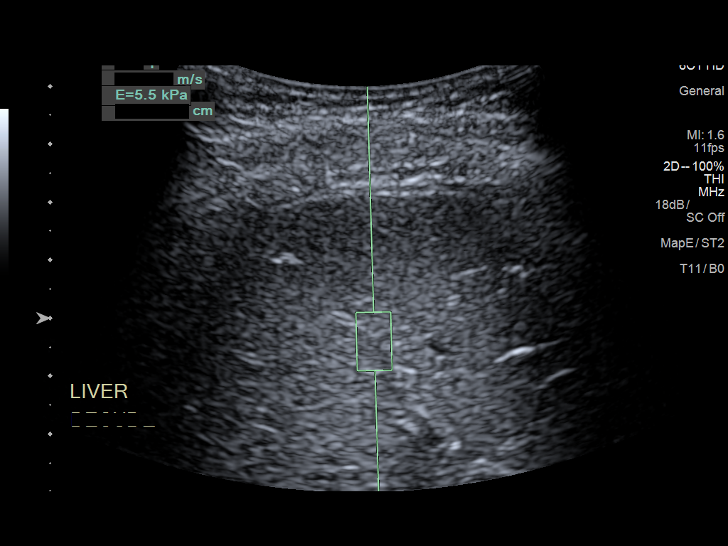
[im 5/15]
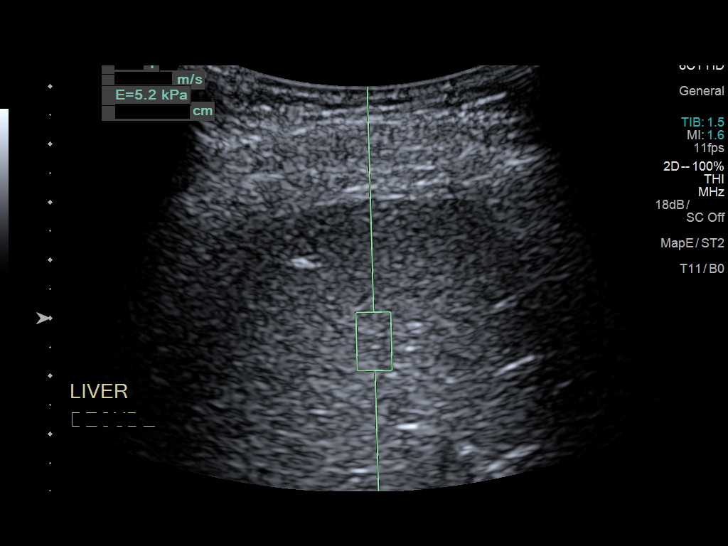
[im 6/15]
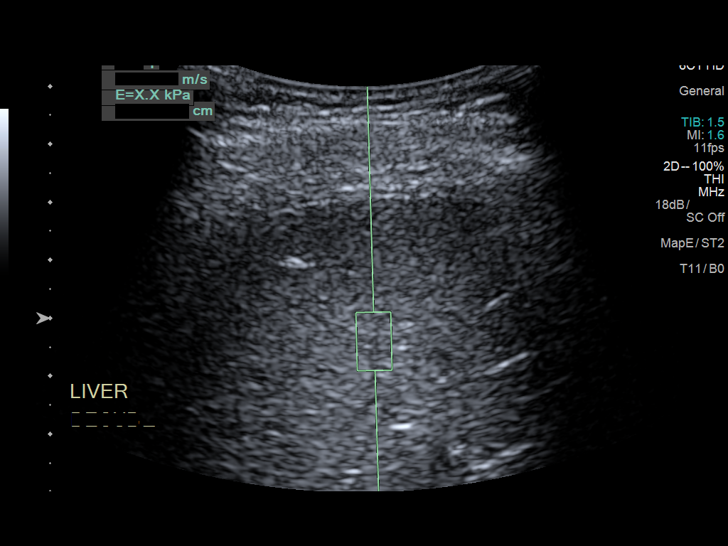
[im 7/15]
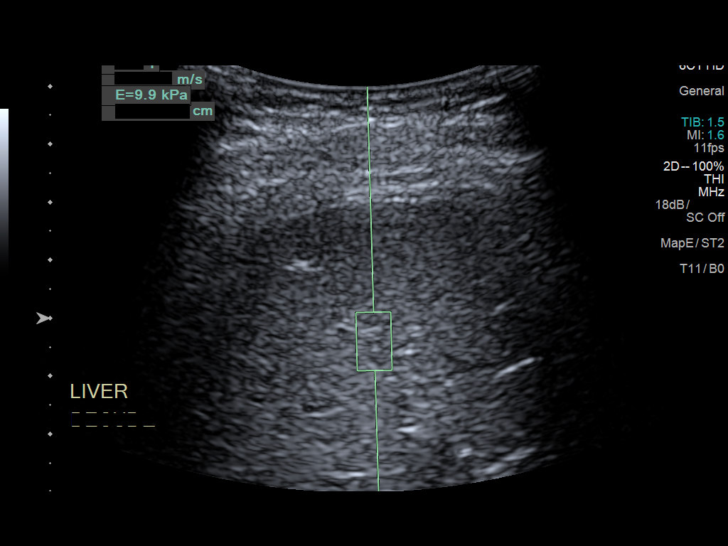
[im 8/15]
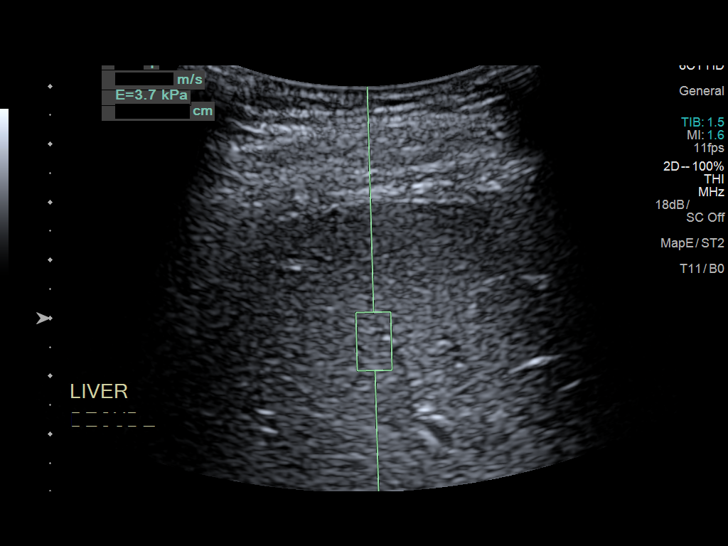
[im 9/15]
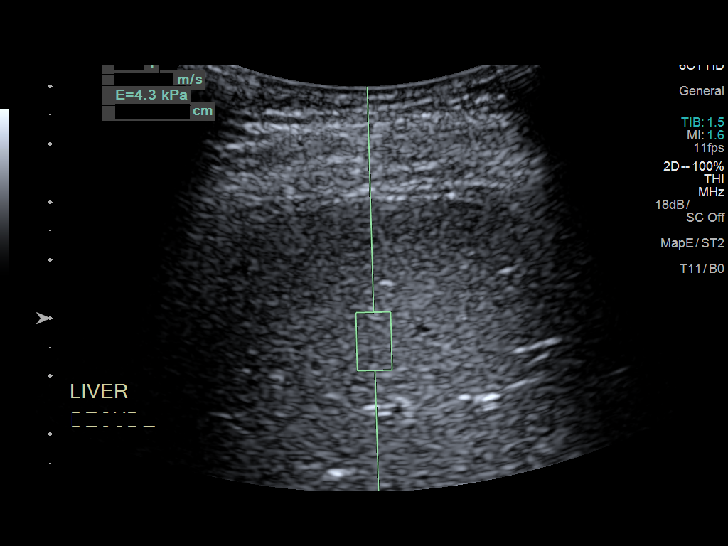
[im 10/15]
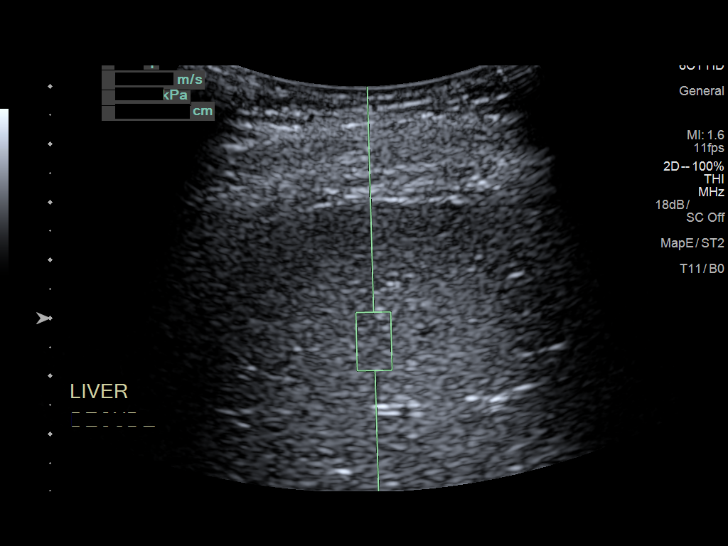
[im 11/15]
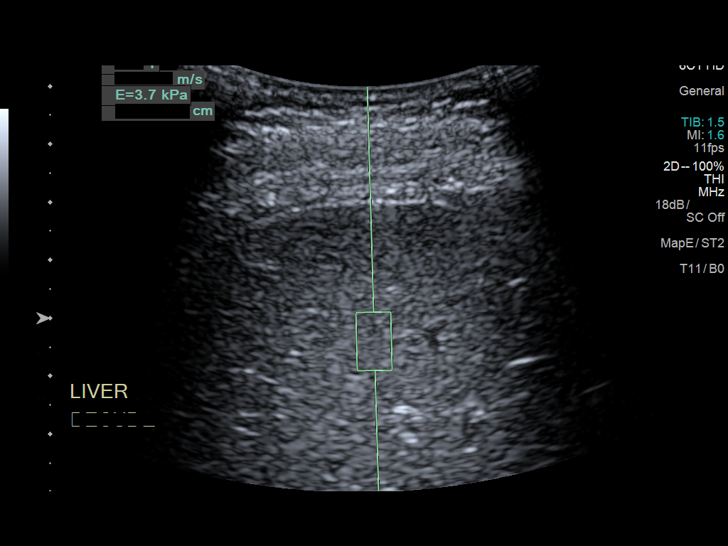
[im 13/15]
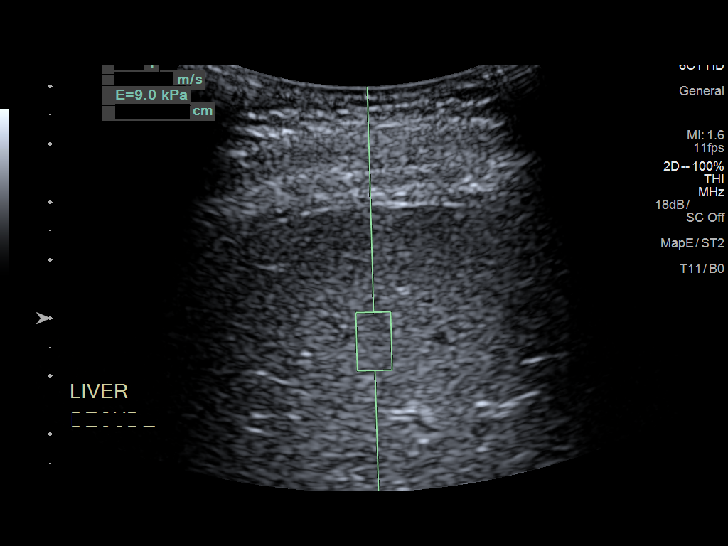
[im 14/15]
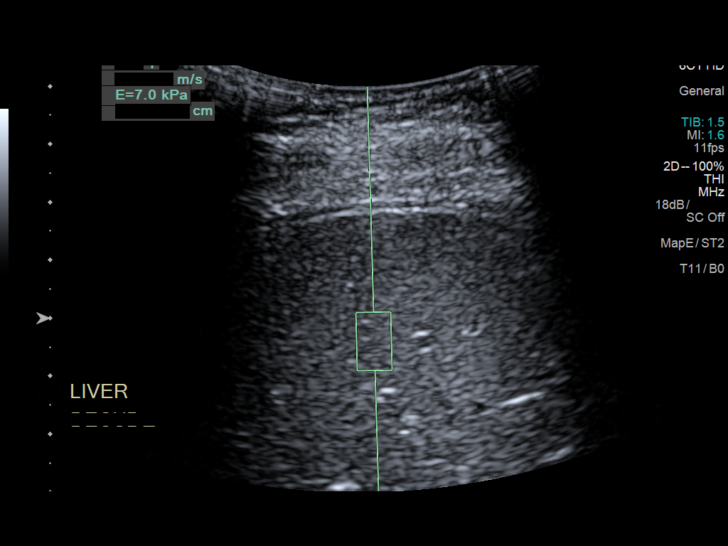
[im 15/15]
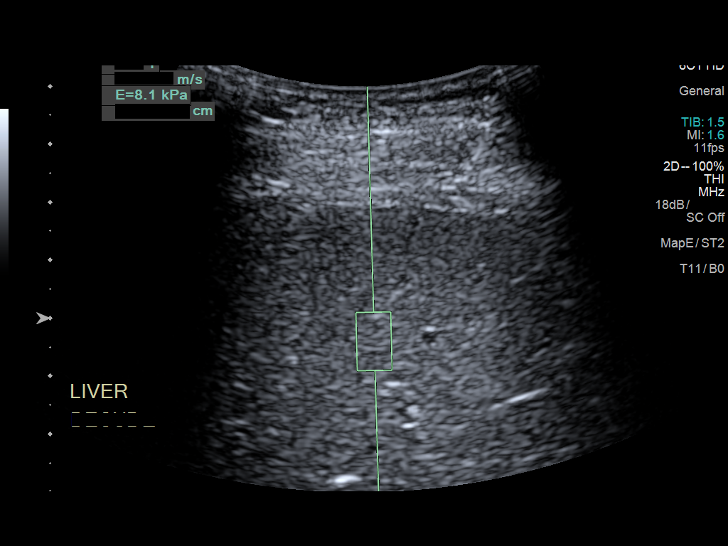

[13 of 15 positions shown; findings below may reference images not displayed]

FINDINGS: ULTRASOUND ABDOMEN

Gallbladder: No gallstones or wall thickening visualized. No
sonographic Murphy sign noted by sonographer. A tiny 3 mm non mobile
gallbladder polyp is noted.

Common bile duct: Diameter: 3 mm, within normal limits.

Liver: No focal lesion identified. Within normal limits in
parenchymal echogenicity.

IVC: No abnormality visualized.

Pancreas: Visualized portion unremarkable.

Spleen: Size and appearance within normal limits.

Right Kidney: Length: 10.2 cm. Echogenicity within normal limits. No
mass or hydronephrosis visualized.

Left Kidney: Length: 11.2 cm. Echogenicity within normal limits. No
mass or hydronephrosis visualized.

Abdominal aorta: No aneurysm visualized.

Other findings: None.

ULTRASOUND HEPATIC ELASTOGRAPHY

Device: Siemens Helix VTQ

Patient position: Left Lateral Decubitus

Transducer 6C1

Number of measurements: 10

Hepatic segment:  8

Median velocity:   1.54  m/sec

IQR:

IQR/Median velocity ratio:

Corresponding Metavir fibrosis score:  F2 + some F3

Risk of fibrosis: Moderate

Limitations of exam: None

Pertinent findings noted on other imaging exams:  None

Please note that abnormal shear wave velocities may also be
identified in clinical settings other than with hepatic fibrosis,
such as: acute hepatitis, elevated right heart and central venous
pressures including use of beta blockers, Fieschi disease
(Es), infiltrative processes such as
mastocytosis/amyloidosis/infiltrative tumor, extrahepatic
cholestasis, in the post-prandial state, and liver transplantation.
Correlation with patient history, laboratory data, and clinical
condition recommended.
IMPRESSION: ULTRASOUND ABDOMEN:
Tiny 3 mm gallbladder polyp noted. No definite gallstones or biliary
ductal dilatation.

Unremarkable sonographic appearance of liver. No liver mass
identified.

ULTRASOUND HEPATIC ELASTOGRAPHY:

Median hepatic shear wave velocity is calculated at 1.54 m/sec.

Corresponding Metavir fibrosis score is  F2 + some F3.

Risk of fibrosis is Moderate.

Follow-up: Additional testing appropriate

## 2018-08-21 ENCOUNTER — Other Ambulatory Visit: Payer: Self-pay | Admitting: Nurse Practitioner

## 2018-08-21 DIAGNOSIS — B1920 Unspecified viral hepatitis C without hepatic coma: Secondary | ICD-10-CM

## 2018-09-03 ENCOUNTER — Other Ambulatory Visit: Payer: BC Managed Care – PPO

## 2018-09-11 ENCOUNTER — Ambulatory Visit
Admission: RE | Admit: 2018-09-11 | Discharge: 2018-09-11 | Disposition: A | Discharge status: Home / Self Care | Payer: BC Managed Care – PPO | Source: Ambulatory Visit | Attending: Nurse Practitioner | Admitting: Nurse Practitioner

## 2018-09-11 DIAGNOSIS — B1920 Unspecified viral hepatitis C without hepatic coma: Secondary | ICD-10-CM

## 2018-12-25 IMAGING — US US ABDOMEN COMPLETE W/ ELASTOGRAPHY
1 series · 13 of 25 positions shown · non-contrast
Comparison: Ultrasound 05/08/2016

CLINICAL DATA: Chronic hepatitis-C.



[Series 1: us abdomen complete w/ elastography · 0.15mm/px · 13 of 84 slices shown]
[im 1/84]
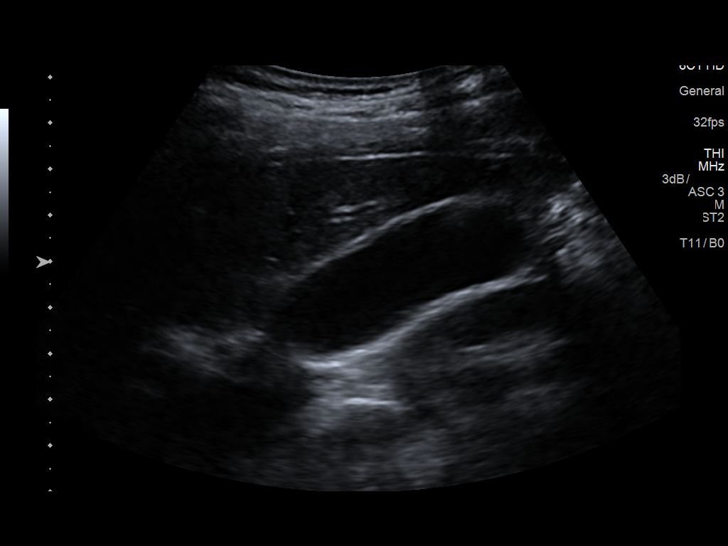
[im 7/84]
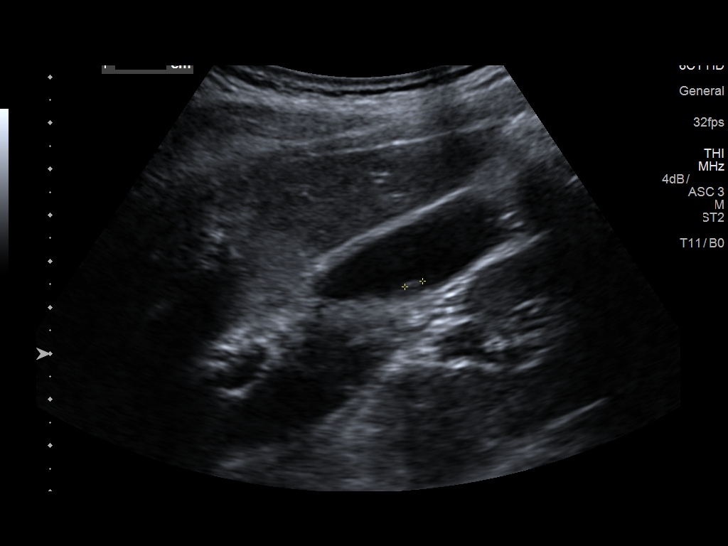
[im 14/84]
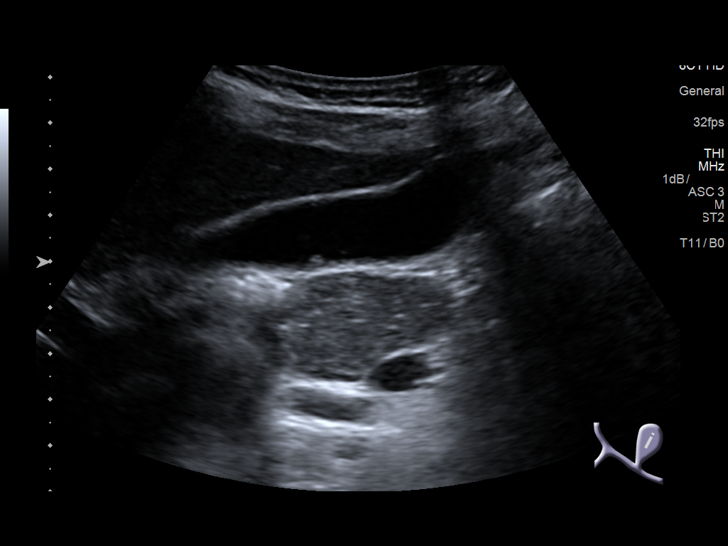
[im 21/84]
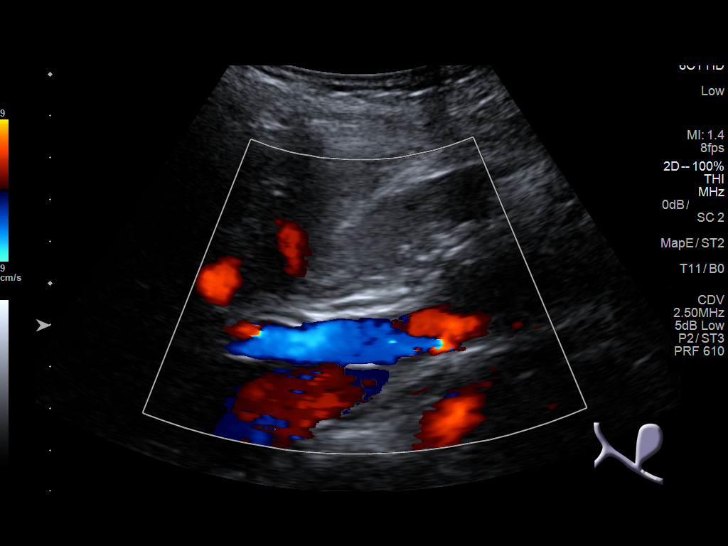
[im 28/84]
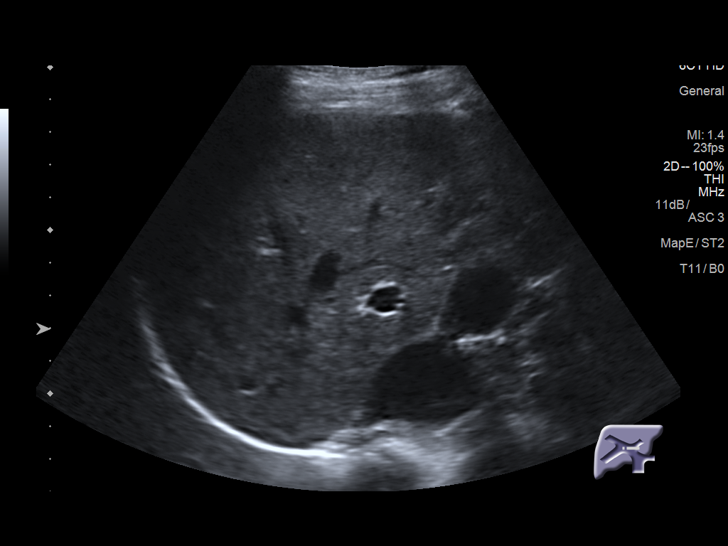
[im 35/84]
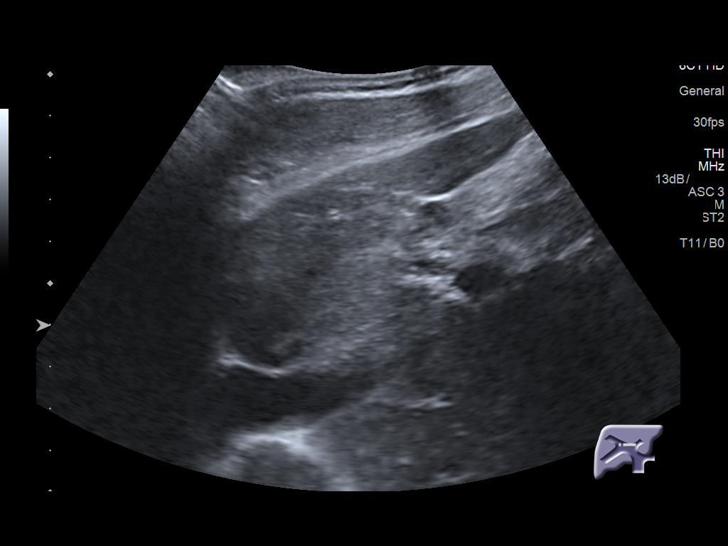
[im 42/84]
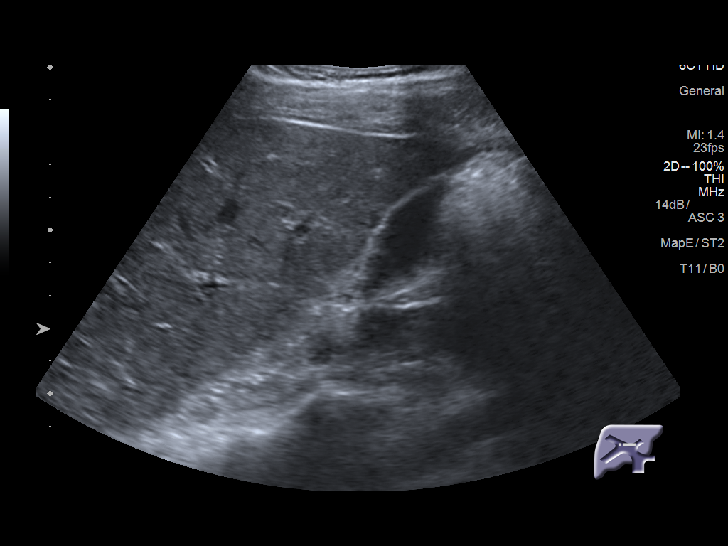
[im 49/84]
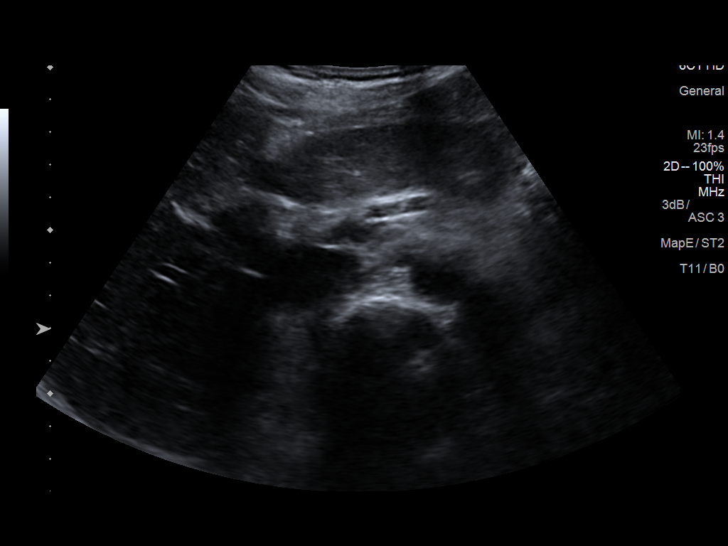
[im 56/84]
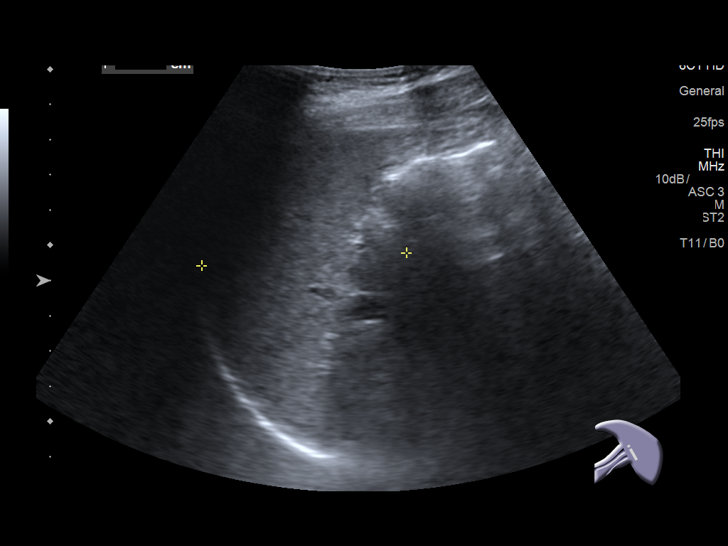
[im 63/84]
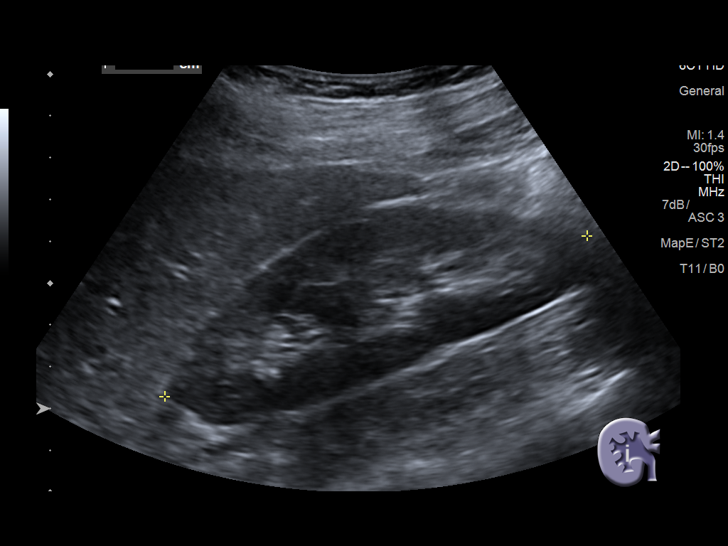
[im 70/84]
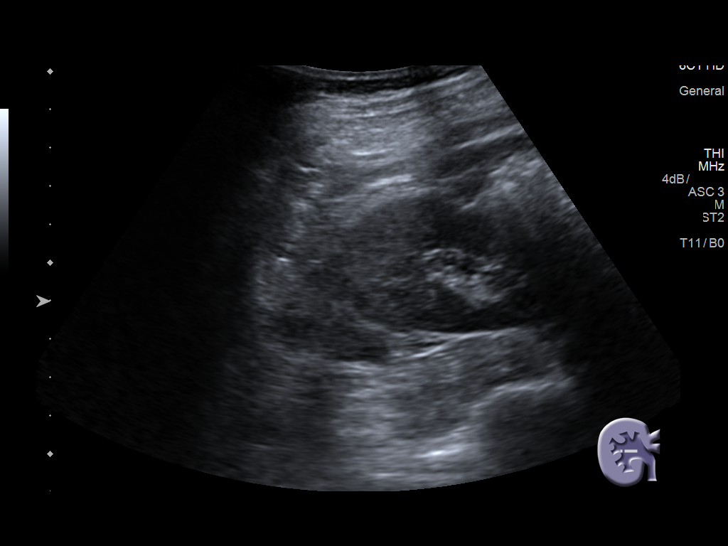
[im 77/84]
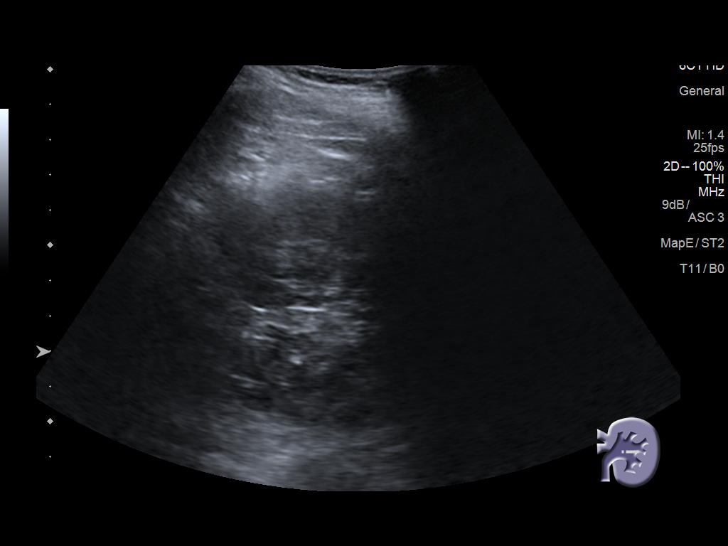
[im 84/84]
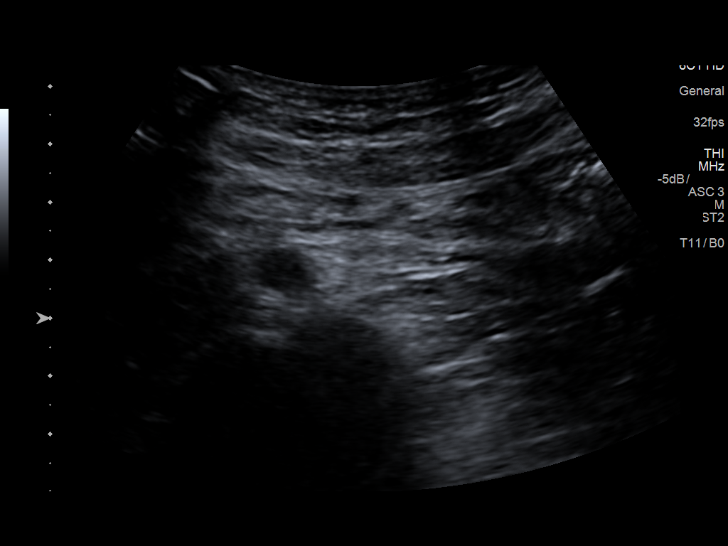

[13 of 25 positions shown; findings below may reference images not displayed]

FINDINGS: ULTRASOUND ABDOMEN

Gallbladder: Small polyp measuring 4 mm. No echogenic gallstones.
Gallbladder wall thickening or pericholecystic fluid.

Common bile duct: Diameter: Normal at 3 mm

Liver: No focal lesion identified. Within normal limits in
parenchymal echogenicity. Portal vein is patent on color Doppler
imaging with normal direction of blood flow towards the liver.

IVC: No abnormality visualized.

Pancreas: Visualized portion unremarkable.

Spleen: Size and appearance within normal limits.

Right Kidney: Length: 10.8 cm. Echogenicity within normal limits. No
mass or hydronephrosis visualized.

Left Kidney: Length: 11.2 cm. Echogenicity within normal limits. No
mass or hydronephrosis visualized.

Abdominal aorta: No aneurysm visualized.

Other findings: None.

ULTRASOUND HEPATIC ELASTOGRAPHY

Device: Siemens Helix VTQ

Patient position: Left Lateral Decubitus

Transducer 6C1

Number of measurements: 10

Hepatic segment:  8

Median velocity:   1.92  m/sec

IQR:

IQR/Median velocity ratio:

Corresponding Metavir fibrosis score:  F2 + some F3

Risk of fibrosis: Moderate

Limitations of exam: None

Pertinent findings noted on other imaging exams:  None

Please note that abnormal shear wave velocities may also be
identified in clinical settings other than with hepatic fibrosis,
such as: acute hepatitis, elevated right heart and central venous
pressures including use of beta blockers, Huugo disease
(Jumper), infiltrative processes such as
mastocytosis/amyloidosis/infiltrative tumor, extrahepatic
cholestasis, in the post-prandial state, and liver transplantation.
Correlation with patient history, laboratory data, and clinical
condition recommended.
IMPRESSION: ULTRASOUND ABDOMEN:
Negative

ULTRASOUND HEPATIC ELASTOGRAPHY:

Median hepatic shear wave velocity is calculated at 1.92 m/sec.

Corresponding Metavir fibrosis score is  F2 + some F3.

Risk of fibrosis is Moderate.

Follow-up: Additional testing appropriate

## 2020-03-07 IMAGING — US US ABDOMEN COMPLETE WITH ELASTOGRAPHY
1 series · 13 of 13 positions shown · non-contrast
Comparison: 12/11/2016

CLINICAL DATA: Chronic hepatitis-C without hepatic coma.



[Series 1: us abdomen complete with elastography · 0.11mm/px · 13 of 13 slices shown]
[im 1/13]
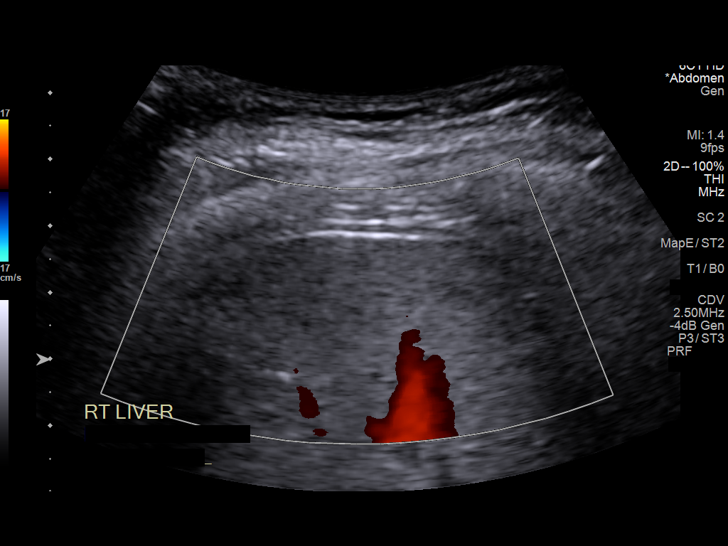
[im 2/13]
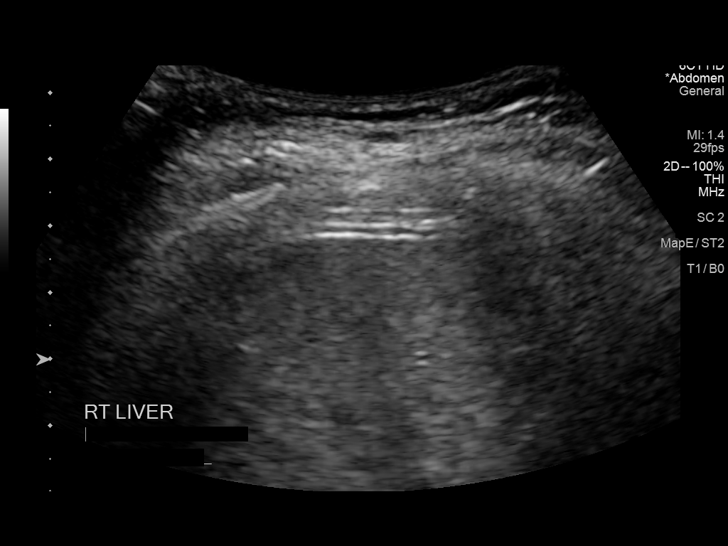
[im 3/13]
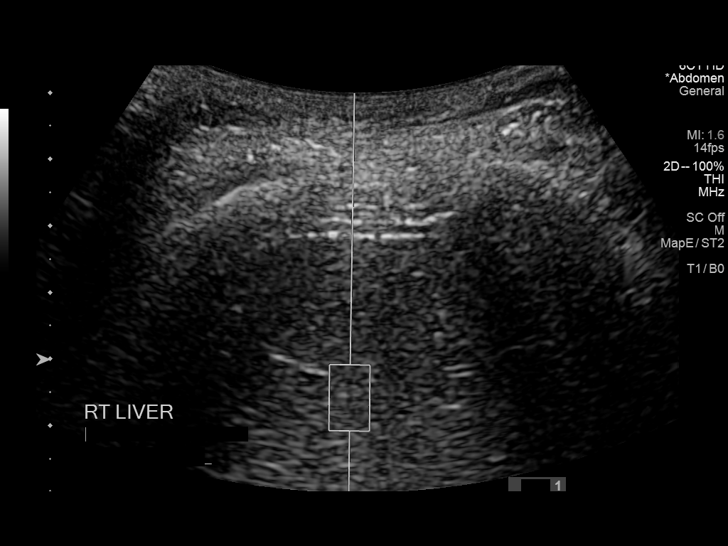
[im 4/13]
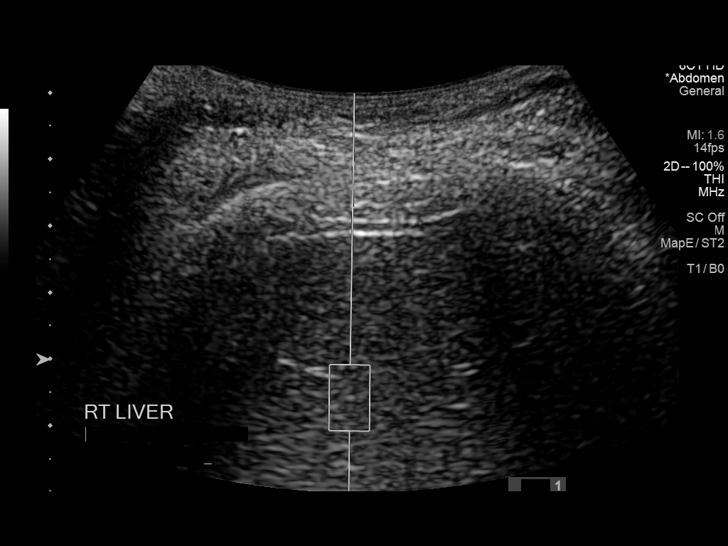
[im 5/13]
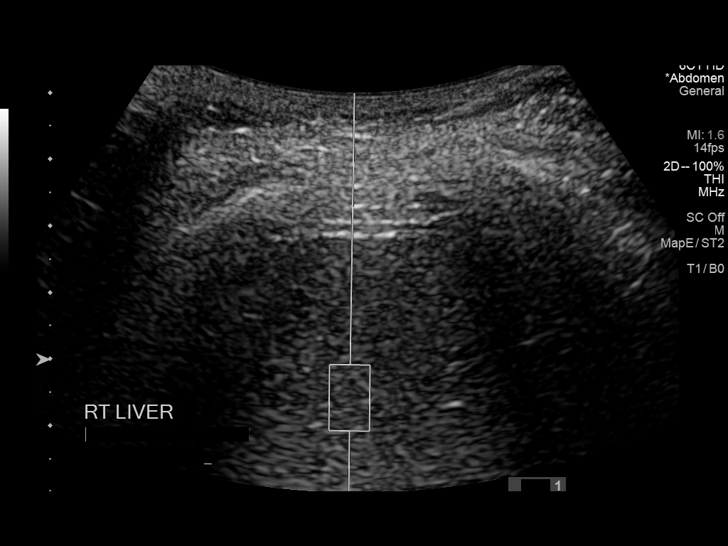
[im 6/13]
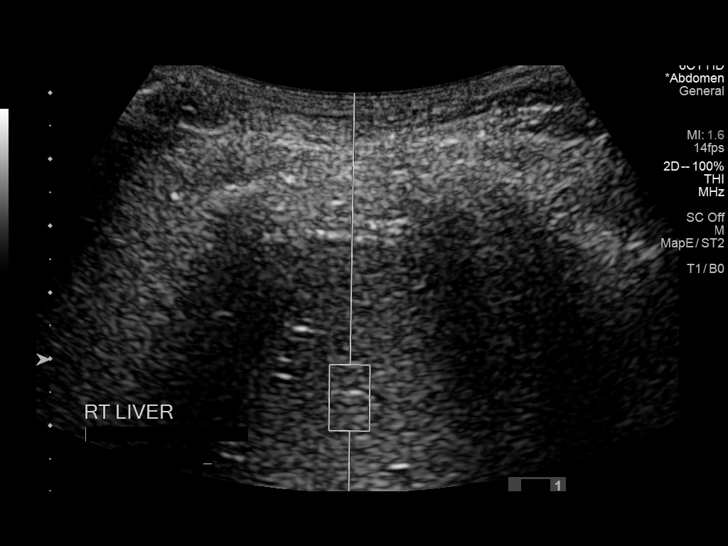
[im 7/13]
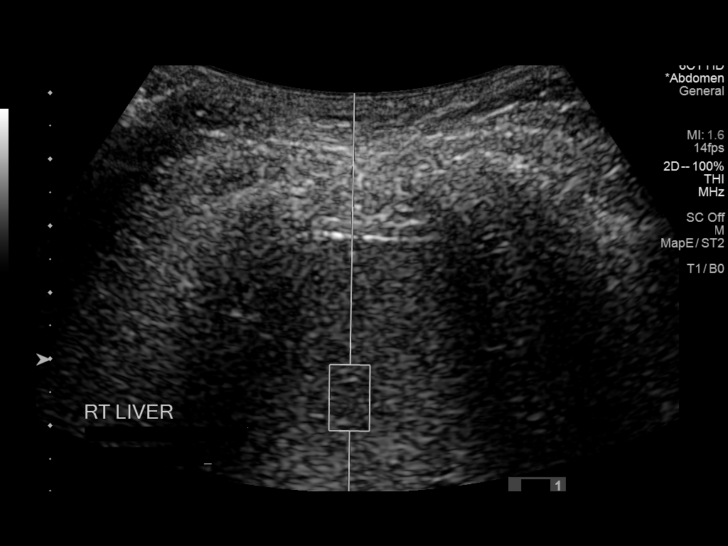
[im 8/13]
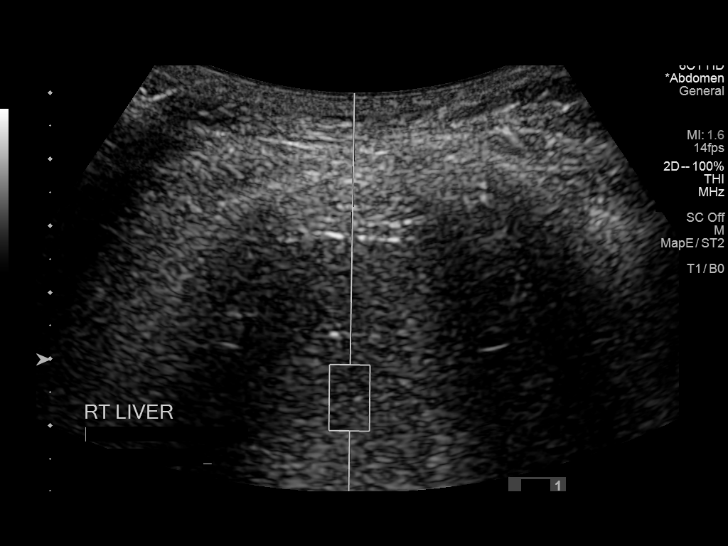
[im 9/13]
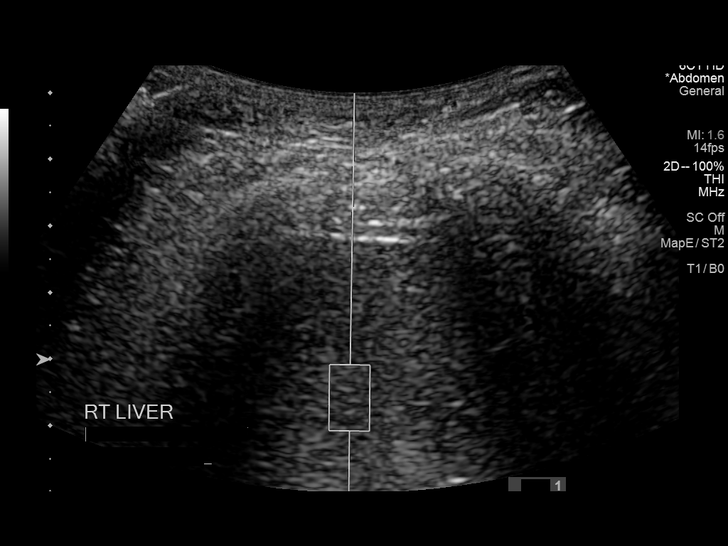
[im 10/13]
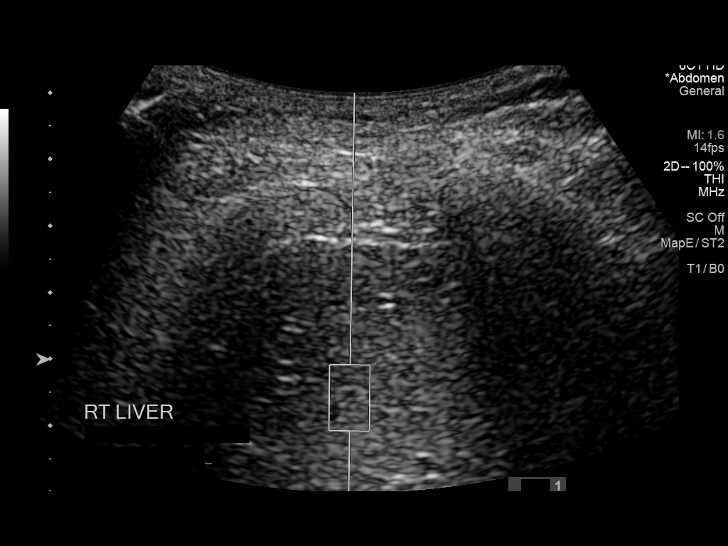
[im 11/13]
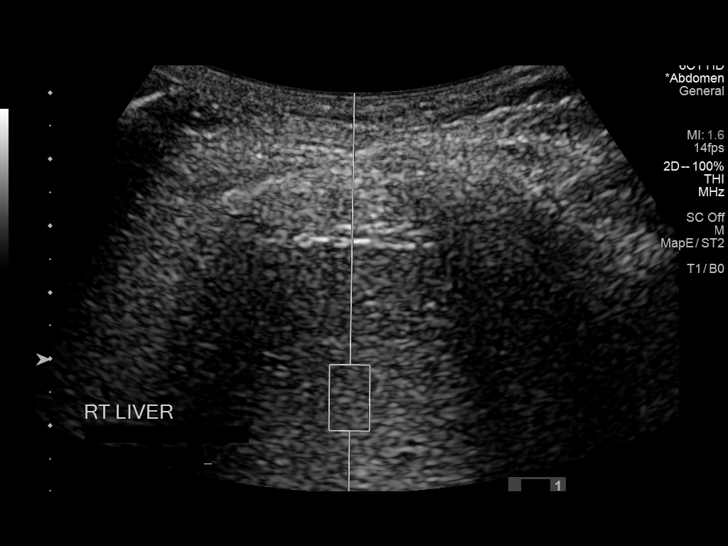
[im 12/13]
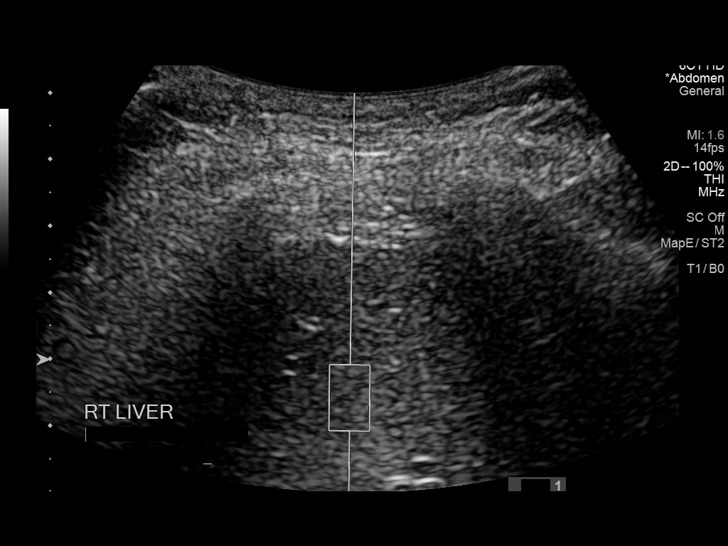
[im 13/13]
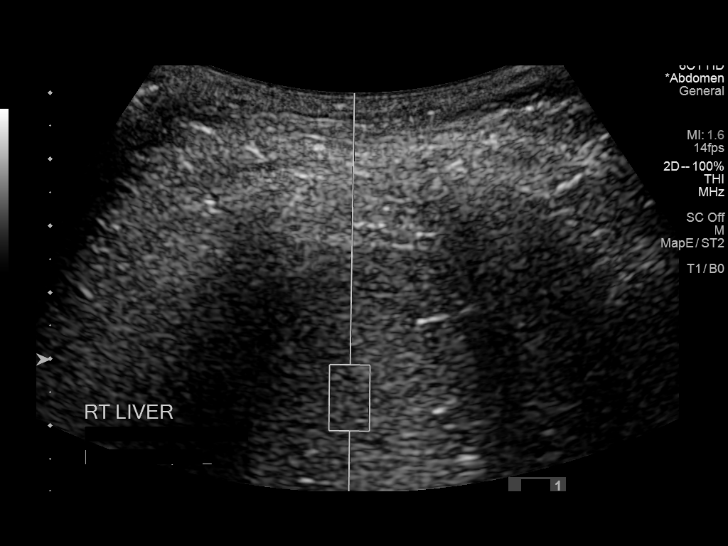

[13 of 13 positions shown; findings below may reference images not displayed]

FINDINGS: ULTRASOUND ABDOMEN

Gallbladder: No gallstones or wall thickening visualized. No
sonographic Murphy sign noted by sonographer. Two tiny non-mobile
gallbladder polyps are noted largest measuring 3 mm.

Common bile duct: Diameter: 3 mm, within normal limits.

Liver: No focal lesion identified. Within normal limits in
parenchymal echogenicity. Portal vein is patent on color Doppler
imaging with normal direction of blood flow towards the liver.

IVC: No abnormality visualized.

Pancreas: Visualized portion unremarkable.

Spleen: Size and appearance within normal limits.

Right Kidney: Length: 9.4 cm. Echogenicity within normal limits. No
mass or hydronephrosis visualized.

Left Kidney: Length: 10.1 cm. Echogenicity within normal limits. No
mass or hydronephrosis visualized.

Abdominal aorta: No aneurysm visualized.

Other findings: None.

ULTRASOUND HEPATIC ELASTOGRAPHY

Device: Siemens Helix VTQ

Patient position: Oblique

Transducer 6C1

Number of measurements: 10

Hepatic segment:  8

Median velocity:   0.87 m/sec

IQR:

IQR/Median velocity ratio:

Corresponding Metavir fibrosis score:  F0/F1

Risk of fibrosis: Minimal

Limitations of exam: None

Please note that abnormal shear wave velocities may also be
identified in clinical settings other than with hepatic fibrosis,
such as: acute hepatitis, elevated right heart and central venous
pressures including use of beta blockers, Nic disease
(Prada), infiltrative processes such as
mastocytosis/amyloidosis/infiltrative tumor, extrahepatic
cholestasis, in the post-prandial state, and liver transplantation.
Correlation with patient history, laboratory data, and clinical
condition recommended.
IMPRESSION: ULTRASOUND ABDOMEN:

Two tiny gallbladder polyps, largest measuring 3 mm. Otherwise
unremarkable exam.

ULTRASOUND HEPATIC ELASTOGRAPHY:

Median hepatic shear wave velocity is calculated at 0.87 m/sec.

Corresponding Metavir fibrosis score is F0/F1.

Risk of fibrosis is minimal.

Follow-up: None required

## 2020-07-06 LAB — OB RESULTS CONSOLE GC/CHLAMYDIA
Chlamydia: NEGATIVE
Gonorrhea: NEGATIVE

## 2020-07-31 LAB — OB RESULTS CONSOLE HEPATITIS B SURFACE ANTIGEN: Hepatitis B Surface Ag: NEGATIVE

## 2020-07-31 LAB — OB RESULTS CONSOLE ABO/RH: RH Type: POSITIVE

## 2020-07-31 LAB — OB RESULTS CONSOLE RUBELLA ANTIBODY, IGM: Rubella: IMMUNE

## 2020-07-31 LAB — OB RESULTS CONSOLE ANTIBODY SCREEN: Antibody Screen: NEGATIVE

## 2020-07-31 LAB — OB RESULTS CONSOLE RPR: RPR: NONREACTIVE

## 2020-07-31 LAB — OB RESULTS CONSOLE HIV ANTIBODY (ROUTINE TESTING): HIV: NONREACTIVE

## 2020-09-28 ENCOUNTER — Other Ambulatory Visit: Payer: Self-pay | Admitting: Obstetrics

## 2020-09-28 DIAGNOSIS — O368390 Maternal care for abnormalities of the fetal heart rate or rhythm, unspecified trimester, not applicable or unspecified: Secondary | ICD-10-CM

## 2020-10-02 ENCOUNTER — Other Ambulatory Visit: Payer: Self-pay

## 2020-10-11 ENCOUNTER — Encounter: Payer: Self-pay | Admitting: *Deleted

## 2020-10-13 ENCOUNTER — Other Ambulatory Visit: Payer: Self-pay

## 2020-10-13 ENCOUNTER — Ambulatory Visit: Payer: BC Managed Care – PPO | Admitting: *Deleted

## 2020-10-13 ENCOUNTER — Encounter: Payer: Self-pay | Admitting: *Deleted

## 2020-10-13 ENCOUNTER — Ambulatory Visit: Payer: BC Managed Care – PPO | Attending: Obstetrics

## 2020-10-13 VITALS — BP 113/63 | HR 70

## 2020-10-13 DIAGNOSIS — O368390 Maternal care for abnormalities of the fetal heart rate or rhythm, unspecified trimester, not applicable or unspecified: Secondary | ICD-10-CM | POA: Diagnosis present

## 2020-10-13 DIAGNOSIS — O34219 Maternal care for unspecified type scar from previous cesarean delivery: Secondary | ICD-10-CM | POA: Diagnosis not present

## 2020-10-13 DIAGNOSIS — O368320 Maternal care for abnormalities of the fetal heart rate or rhythm, second trimester, not applicable or unspecified: Secondary | ICD-10-CM

## 2020-10-13 DIAGNOSIS — Z3689 Encounter for other specified antenatal screening: Secondary | ICD-10-CM

## 2020-10-13 DIAGNOSIS — O99342 Other mental disorders complicating pregnancy, second trimester: Secondary | ICD-10-CM

## 2020-10-13 DIAGNOSIS — Z3A21 21 weeks gestation of pregnancy: Secondary | ICD-10-CM

## 2021-01-16 ENCOUNTER — Other Ambulatory Visit: Payer: Self-pay | Admitting: Obstetrics and Gynecology

## 2021-01-16 DIAGNOSIS — Z3490 Encounter for supervision of normal pregnancy, unspecified, unspecified trimester: Secondary | ICD-10-CM

## 2021-01-30 LAB — OB RESULTS CONSOLE GBS: GBS: NEGATIVE

## 2021-02-05 ENCOUNTER — Encounter (HOSPITAL_COMMUNITY): Payer: Self-pay

## 2021-02-05 NOTE — Patient Instructions (Addendum)
Delvin Chrzanowski  02/05/2021   Your procedure is scheduled on:  02/12/2021  Arrive at 66 at [employer] [address] on [address] at [facility]  and [address]. You are invited to use the FREE valet parking or use the Visitor's parking deck.  Pick up the phone at the desk and dial [phone].  Call this number if you have problems the morning of surgery: [phone]  Remember:   Do not eat food:(After Midnight) Desps de medianoche.  Do not drink clear liquids: (After Midnight) Desps de medianoche.  Take these medicines the morning of surgery with A SIP OF WATER:  none   Do not wear jewelry, make-up or nail polish.  Do not wear lotions, powders, or perfumes. Do not wear deodorant.  Do not shave 48 hours prior to surgery.  Do not bring valuables to the hospital.  [facility] is not   responsible for any belongings or valuables brought to the hospital.  Contacts, dentures or bridgework may not be worn into surgery.  Leave suitcase in the car. After surgery it may be brought to your room.  For patients admitted to the hospital, checkout time is 11:00 AM the day of              discharge.      Please read over the following fact sheets that you were given:     Preparing for Surgery

## 2021-02-06 ENCOUNTER — Encounter (HOSPITAL_COMMUNITY): Payer: Self-pay | Admitting: Obstetrics and Gynecology

## 2021-02-08 NOTE — Patient Instructions (Signed)
Yesenia Mora  02/08/2021   Your procedure is scheduled on:  02/12/2021  Arrive at 1015 at [employer] [address] on [address] at [facility]  and [employer]. You are invited to use the FREE valet parking or use the Visitor's parking deck.  Pick up the phone at the desk and dial [phone].  Call this number if you have problems the morning of surgery: [phone]  Remember:   Do not eat food:(After Midnight) Desps de medianoche.  Do not drink clear liquids: (After Midnight) Desps de medianoche.  Take these medicines the morning of surgery with A SIP OF WATER:  none   Do not wear jewelry, make-up or nail polish.  Do not wear lotions, powders, or perfumes. Do not wear deodorant.  Do not shave 48 hours prior to surgery.  Do not bring valuables to the hospital.  [facility] is not   responsible for any belongings or valuables brought to the hospital.  Contacts, dentures or bridgework may not be worn into surgery.  Leave suitcase in the car. After surgery it may be brought to your room.  For patients admitted to the hospital, checkout time is 11:00 AM the day of              discharge.      Please read over the following fact sheets that you were given:     Preparing for Surgery

## 2021-02-09 ENCOUNTER — Other Ambulatory Visit: Payer: Self-pay

## 2021-02-09 ENCOUNTER — Other Ambulatory Visit: Payer: Self-pay | Admitting: Obstetrics and Gynecology

## 2021-02-09 ENCOUNTER — Other Ambulatory Visit (HOSPITAL_COMMUNITY)
Admission: RE | Admit: 2021-02-09 | Discharge: 2021-02-09 | Disposition: A | Discharge status: Home / Self Care | Payer: BC Managed Care – PPO | Source: Ambulatory Visit | Attending: Obstetrics and Gynecology | Admitting: Obstetrics and Gynecology

## 2021-02-09 DIAGNOSIS — Z01812 Encounter for preprocedural laboratory examination: Secondary | ICD-10-CM | POA: Insufficient documentation

## 2021-02-09 DIAGNOSIS — Z3493 Encounter for supervision of normal pregnancy, unspecified, third trimester: Secondary | ICD-10-CM | POA: Insufficient documentation

## 2021-02-09 DIAGNOSIS — Z3490 Encounter for supervision of normal pregnancy, unspecified, unspecified trimester: Secondary | ICD-10-CM

## 2021-02-09 LAB — TYPE AND SCREEN
ABO/RH(D): A POS
Antibody Screen: NEGATIVE

## 2021-02-09 LAB — CBC
HCT: 33.6 % — ABNORMAL LOW (ref 36.0–46.0)
Hemoglobin: 10.8 g/dL — ABNORMAL LOW (ref 12.0–15.0)
MCH: 26.7 pg (ref 26.0–34.0)
MCHC: 32.1 g/dL (ref 30.0–36.0)
MCV: 83.2 fL (ref 80.0–100.0)
Platelets: 243 10*3/uL (ref 150–400)
RBC: 4.04 MIL/uL (ref 3.87–5.11)
RDW: 14.6 % (ref 11.5–15.5)
WBC: 7.3 10*3/uL (ref 4.0–10.5)
nRBC: 0 % (ref 0.0–0.2)

## 2021-02-10 ENCOUNTER — Inpatient Hospital Stay (HOSPITAL_COMMUNITY): Payer: BC Managed Care – PPO

## 2021-02-10 ENCOUNTER — Other Ambulatory Visit: Payer: Self-pay

## 2021-02-10 ENCOUNTER — Encounter (HOSPITAL_COMMUNITY)
Admission: AD | Disposition: A | Discharge status: Home / Self Care | Payer: Self-pay | Source: Home / Self Care | Attending: Obstetrics and Gynecology

## 2021-02-10 ENCOUNTER — Encounter (HOSPITAL_COMMUNITY): Payer: Self-pay | Admitting: Obstetrics and Gynecology

## 2021-02-10 ENCOUNTER — Inpatient Hospital Stay (HOSPITAL_COMMUNITY): Payer: BC Managed Care – PPO | Admitting: Anesthesiology

## 2021-02-10 ENCOUNTER — Inpatient Hospital Stay (HOSPITAL_COMMUNITY)
Admission: AD | Admit: 2021-02-10 | Discharge: 2021-02-12 | DRG: 787 | Disposition: A | Discharge status: Home / Self Care | Payer: BC Managed Care – PPO | Attending: Obstetrics and Gynecology | Admitting: Obstetrics and Gynecology

## 2021-02-10 DIAGNOSIS — O9081 Anemia of the puerperium: Secondary | ICD-10-CM | POA: Diagnosis not present

## 2021-02-10 DIAGNOSIS — O4593 Premature separation of placenta, unspecified, third trimester: Principal | ICD-10-CM | POA: Diagnosis present

## 2021-02-10 DIAGNOSIS — Z3490 Encounter for supervision of normal pregnancy, unspecified, unspecified trimester: Secondary | ICD-10-CM

## 2021-02-10 DIAGNOSIS — O368390 Maternal care for abnormalities of the fetal heart rate or rhythm, unspecified trimester, not applicable or unspecified: Secondary | ICD-10-CM

## 2021-02-10 DIAGNOSIS — D62 Acute posthemorrhagic anemia: Secondary | ICD-10-CM | POA: Diagnosis not present

## 2021-02-10 DIAGNOSIS — Z98891 History of uterine scar from previous surgery: Secondary | ICD-10-CM

## 2021-02-10 DIAGNOSIS — Z3A39 39 weeks gestation of pregnancy: Secondary | ICD-10-CM | POA: Diagnosis not present

## 2021-02-10 DIAGNOSIS — O26893 Other specified pregnancy related conditions, third trimester: Secondary | ICD-10-CM | POA: Diagnosis present

## 2021-02-10 DIAGNOSIS — O34211 Maternal care for low transverse scar from previous cesarean delivery: Secondary | ICD-10-CM | POA: Diagnosis present

## 2021-02-10 LAB — CBC
HCT: 30.4 % — ABNORMAL LOW (ref 36.0–46.0)
Hemoglobin: 9.2 g/dL — ABNORMAL LOW (ref 12.0–15.0)
MCH: 26.1 pg (ref 26.0–34.0)
MCHC: 30.3 g/dL (ref 30.0–36.0)
MCV: 86.1 fL (ref 80.0–100.0)
Platelets: 224 10*3/uL (ref 150–400)
RBC: 3.53 MIL/uL — ABNORMAL LOW (ref 3.87–5.11)
RDW: 14.7 % (ref 11.5–15.5)
WBC: 17.5 10*3/uL — ABNORMAL HIGH (ref 4.0–10.5)
nRBC: 0 % (ref 0.0–0.2)

## 2021-02-10 LAB — SARS CORONAVIRUS 2 (TAT 6-24 HRS): SARS Coronavirus 2: NEGATIVE

## 2021-02-10 LAB — RPR: RPR Ser Ql: NONREACTIVE

## 2021-02-10 SURGERY — Surgical Case
Anesthesia: General | Laterality: Bilateral | Wound class: Clean Contaminated

## 2021-02-10 MED ORDER — CEFAZOLIN SODIUM-DEXTROSE 2-4 GM/100ML-% IV SOLN
INTRAVENOUS | Status: AC
  Filled 2021-02-10: qty 100

## 2021-02-10 MED ORDER — SENNOSIDES-DOCUSATE SODIUM 8.6-50 MG PO TABS
2.0000 | ORAL_TABLET | Freq: Every day | ORAL | Status: DC
  Administered 2021-02-11 – 2021-02-12 (×2): 2 via ORAL
  Filled 2021-02-10 (×2): qty 2

## 2021-02-10 MED ORDER — DEXMEDETOMIDINE (PRECEDEX) IN NS 20 MCG/5ML (4 MCG/ML) IV SYRINGE
PREFILLED_SYRINGE | INTRAVENOUS | Status: DC | PRN
  Administered 2021-02-10: 8 ug via INTRAVENOUS

## 2021-02-10 MED ORDER — SUCCINYLCHOLINE CHLORIDE 200 MG/10ML IV SOSY
PREFILLED_SYRINGE | INTRAVENOUS | Status: DC | PRN
  Administered 2021-02-10: 100 mg via INTRAVENOUS

## 2021-02-10 MED ORDER — OXYTOCIN-SODIUM CHLORIDE 30-0.9 UT/500ML-% IV SOLN
INTRAVENOUS | Status: DC | PRN
  Administered 2021-02-10: 300 mL via INTRAVENOUS

## 2021-02-10 MED ORDER — ONDANSETRON HCL 4 MG/2ML IJ SOLN
INTRAMUSCULAR | Status: DC | PRN
Start: 2021-02-10 — End: 2021-02-10
  Administered 2021-02-10: 4 mg via INTRAVENOUS

## 2021-02-10 MED ORDER — STERILE WATER FOR IRRIGATION IR SOLN
Status: DC | PRN
  Administered 2021-02-10: 1000 mL

## 2021-02-10 MED ORDER — PRENATAL MULTIVITAMIN CH
1.0000 | ORAL_TABLET | Freq: Every day | ORAL | Status: DC
  Administered 2021-02-11 – 2021-02-12 (×2): 1 via ORAL
  Filled 2021-02-10 (×2): qty 1

## 2021-02-10 MED ORDER — SODIUM CHLORIDE 0.9 % IR SOLN
Status: DC | PRN
  Administered 2021-02-10: 1000 mL

## 2021-02-10 MED ORDER — PROPOFOL 10 MG/ML IV BOLUS
INTRAVENOUS | Status: AC
  Filled 2021-02-10: qty 20

## 2021-02-10 MED ORDER — OXYCODONE HCL 5 MG PO TABS: 5.0000 mg | ORAL_TABLET | Freq: Once | ORAL | Status: DC | PRN

## 2021-02-10 MED ORDER — HYDROMORPHONE HCL 1 MG/ML IJ SOLN
INTRAMUSCULAR | Status: AC
  Filled 2021-02-10: qty 0.5

## 2021-02-10 MED ORDER — ONDANSETRON HCL 4 MG/2ML IJ SOLN: 4.0000 mg | Freq: Once | INTRAMUSCULAR | Status: DC | PRN

## 2021-02-10 MED ORDER — IBUPROFEN 600 MG PO TABS
600.0000 mg | ORAL_TABLET | Freq: Four times a day (QID) | ORAL | Status: DC
  Administered 2021-02-11 – 2021-02-12 (×6): 600 mg via ORAL
  Filled 2021-02-10 (×6): qty 1

## 2021-02-10 MED ORDER — FENTANYL CITRATE (PF) 100 MCG/2ML IJ SOLN
INTRAMUSCULAR | Status: AC
  Filled 2021-02-10: qty 2

## 2021-02-10 MED ORDER — LIDOCAINE-EPINEPHRINE (PF) 2 %-1:200000 IJ SOLN
INTRAMUSCULAR | Status: AC
  Filled 2021-02-10: qty 20

## 2021-02-10 MED ORDER — CEFAZOLIN SODIUM-DEXTROSE 2-4 GM/100ML-% IV SOLN
2.0000 g | INTRAVENOUS | Status: AC
  Administered 2021-02-10: 2 g via INTRAVENOUS

## 2021-02-10 MED ORDER — TETANUS-DIPHTH-ACELL PERTUSSIS 5-2.5-18.5 LF-MCG/0.5 IM SUSY: 0.5000 mL | PREFILLED_SYRINGE | Freq: Once | INTRAMUSCULAR | Status: DC

## 2021-02-10 MED ORDER — COCONUT OIL OIL
1.0000 "application " | TOPICAL_OIL | Status: DC | PRN
  Administered 2021-02-11: 1 via TOPICAL

## 2021-02-10 MED ORDER — OXYTOCIN-SODIUM CHLORIDE 30-0.9 UT/500ML-% IV SOLN
INTRAVENOUS | Status: AC
  Filled 2021-02-10: qty 500

## 2021-02-10 MED ORDER — OXYCODONE HCL 5 MG/5ML PO SOLN: 5.0000 mg | Freq: Once | ORAL | Status: DC | PRN

## 2021-02-10 MED ORDER — LACTATED RINGERS IV SOLN: INTRAVENOUS | Status: DC

## 2021-02-10 MED ORDER — KETOROLAC TROMETHAMINE 30 MG/ML IJ SOLN
INTRAMUSCULAR | Status: AC
  Filled 2021-02-10: qty 1

## 2021-02-10 MED ORDER — SERTRALINE HCL 100 MG PO TABS
100.0000 mg | ORAL_TABLET | Freq: Every day | ORAL | Status: DC
  Administered 2021-02-11 (×2): 100 mg via ORAL
  Filled 2021-02-10 (×2): qty 1

## 2021-02-10 MED ORDER — OXYCODONE HCL 5 MG PO TABS
5.0000 mg | ORAL_TABLET | ORAL | Status: DC | PRN
  Administered 2021-02-11 – 2021-02-12 (×2): 5 mg via ORAL
  Filled 2021-02-10 (×2): qty 1

## 2021-02-10 MED ORDER — ONDANSETRON HCL 4 MG/2ML IJ SOLN
INTRAMUSCULAR | Status: AC
  Filled 2021-02-10: qty 4

## 2021-02-10 MED ORDER — SUCCINYLCHOLINE CHLORIDE 200 MG/10ML IV SOSY
PREFILLED_SYRINGE | INTRAVENOUS | Status: AC
  Filled 2021-02-10: qty 10

## 2021-02-10 MED ORDER — MENTHOL 3 MG MT LOZG: 1.0000 | LOZENGE | OROMUCOSAL | Status: DC | PRN

## 2021-02-10 MED ORDER — DEXMEDETOMIDINE (PRECEDEX) IN NS 20 MCG/5ML (4 MCG/ML) IV SYRINGE
PREFILLED_SYRINGE | INTRAVENOUS | Status: AC
  Filled 2021-02-10: qty 5

## 2021-02-10 MED ORDER — ACETAMINOPHEN 10 MG/ML IV SOLN
INTRAVENOUS | Status: DC | PRN
  Administered 2021-02-10: 1000 mg via INTRAVENOUS

## 2021-02-10 MED ORDER — DIBUCAINE (PERIANAL) 1 % EX OINT: 1.0000 "application " | TOPICAL_OINTMENT | CUTANEOUS | Status: DC | PRN

## 2021-02-10 MED ORDER — MORPHINE SULFATE (PF) 10 MG/ML IV SOLN
INTRAVENOUS | Status: DC | PRN
  Administered 2021-02-10: 1.5 mg via BUCCAL
  Administered 2021-02-10: 1 mg via BUCCAL
  Administered 2021-02-10: 1.5 mg via BUCCAL
  Administered 2021-02-10: 1 mg via BUCCAL

## 2021-02-10 MED ORDER — PHENYLEPHRINE HCL (PRESSORS) 10 MG/ML IV SOLN
INTRAVENOUS | Status: DC | PRN
  Administered 2021-02-10 (×3): 100 ug via INTRAVENOUS

## 2021-02-10 MED ORDER — SIMETHICONE 80 MG PO CHEW
80.0000 mg | CHEWABLE_TABLET | Freq: Three times a day (TID) | ORAL | Status: DC
  Administered 2021-02-11 – 2021-02-12 (×3): 80 mg via ORAL
  Filled 2021-02-10 (×4): qty 1

## 2021-02-10 MED ORDER — POVIDONE-IODINE 10 % EX SWAB: 2.0000 "application " | Freq: Once | CUTANEOUS | Status: DC

## 2021-02-10 MED ORDER — ACETAMINOPHEN 10 MG/ML IV SOLN
INTRAVENOUS | Status: AC
  Filled 2021-02-10: qty 100

## 2021-02-10 MED ORDER — PROPOFOL 10 MG/ML IV BOLUS
INTRAVENOUS | Status: DC | PRN
  Administered 2021-02-10: 50 mg via INTRAVENOUS
  Administered 2021-02-10: 100 mg via INTRAVENOUS

## 2021-02-10 MED ORDER — MORPHINE SULFATE (PF) 4 MG/ML IV SOLN
1.0000 mg | Freq: Once | INTRAVENOUS | Status: DC
  Filled 2021-02-10: qty 1

## 2021-02-10 MED ORDER — DEXAMETHASONE SODIUM PHOSPHATE 10 MG/ML IJ SOLN
INTRAMUSCULAR | Status: DC | PRN
Start: 2021-02-10 — End: 2021-02-10
  Administered 2021-02-10: 10 mg via INTRAVENOUS

## 2021-02-10 MED ORDER — DEXAMETHASONE SODIUM PHOSPHATE 10 MG/ML IJ SOLN
INTRAMUSCULAR | Status: AC
  Filled 2021-02-10: qty 1

## 2021-02-10 MED ORDER — TERBUTALINE SULFATE 1 MG/ML IJ SOLN: 0.2500 mg | Freq: Once | INTRAMUSCULAR | Status: DC

## 2021-02-10 MED ORDER — FERROUS SULFATE 325 (65 FE) MG PO TABS
325.0000 mg | ORAL_TABLET | Freq: Two times a day (BID) | ORAL | Status: DC
  Administered 2021-02-11 – 2021-02-12 (×3): 325 mg via ORAL
  Filled 2021-02-10 (×3): qty 1

## 2021-02-10 MED ORDER — HYDROMORPHONE HCL 1 MG/ML IJ SOLN
0.2500 mg | INTRAMUSCULAR | Status: DC | PRN
  Administered 2021-02-10: 0.25 mg via INTRAVENOUS
  Administered 2021-02-10: 0.5 mg via INTRAVENOUS

## 2021-02-10 MED ORDER — WITCH HAZEL-GLYCERIN EX PADS: 1.0000 "application " | MEDICATED_PAD | CUTANEOUS | Status: DC | PRN

## 2021-02-10 MED ORDER — ZOLPIDEM TARTRATE 5 MG PO TABS: 5.0000 mg | ORAL_TABLET | Freq: Every evening | ORAL | Status: DC | PRN

## 2021-02-10 MED ORDER — OXYTOCIN-SODIUM CHLORIDE 30-0.9 UT/500ML-% IV SOLN: 2.5000 [IU]/h | INTRAVENOUS | Status: AC

## 2021-02-10 MED ORDER — KETOROLAC TROMETHAMINE 30 MG/ML IJ SOLN: 30.0000 mg | Freq: Once | INTRAMUSCULAR | Status: DC | PRN

## 2021-02-10 MED ORDER — OXYTOCIN-SODIUM CHLORIDE 30-0.9 UT/500ML-% IV SOLN: INTRAVENOUS | Status: DC | PRN

## 2021-02-10 MED ORDER — SODIUM CHLORIDE 0.9 % IV SOLN
3.0000 g | Freq: Four times a day (QID) | INTRAVENOUS | Status: DC
  Administered 2021-02-10 – 2021-02-11 (×3): 3 g via INTRAVENOUS
  Filled 2021-02-10 (×5): qty 8

## 2021-02-10 MED ORDER — SOD CITRATE-CITRIC ACID 500-334 MG/5ML PO SOLN: 30.0000 mL | ORAL | Status: DC

## 2021-02-10 MED ORDER — TERBUTALINE SULFATE 1 MG/ML IJ SOLN
INTRAMUSCULAR | Status: AC
  Filled 2021-02-10: qty 1

## 2021-02-10 MED ORDER — ACETAMINOPHEN 500 MG PO TABS
1000.0000 mg | ORAL_TABLET | Freq: Four times a day (QID) | ORAL | Status: DC
  Administered 2021-02-11 – 2021-02-12 (×6): 1000 mg via ORAL
  Filled 2021-02-10 (×6): qty 2

## 2021-02-10 MED ORDER — FENTANYL CITRATE (PF) 250 MCG/5ML IJ SOLN
INTRAMUSCULAR | Status: AC
  Filled 2021-02-10: qty 5

## 2021-02-10 MED ORDER — SIMETHICONE 80 MG PO CHEW: 80.0000 mg | CHEWABLE_TABLET | ORAL | Status: DC | PRN

## 2021-02-10 MED ORDER — FENTANYL CITRATE (PF) 100 MCG/2ML IJ SOLN
INTRAMUSCULAR | Status: DC | PRN
  Administered 2021-02-10: 50 ug via INTRAVENOUS
  Administered 2021-02-10: 100 ug via INTRAVENOUS
  Administered 2021-02-10: 50 ug via INTRAVENOUS
  Administered 2021-02-10 (×2): 100 ug via INTRAVENOUS

## 2021-02-10 MED ORDER — DIPHENHYDRAMINE HCL 25 MG PO CAPS: 25.0000 mg | ORAL_CAPSULE | Freq: Four times a day (QID) | ORAL | Status: DC | PRN

## 2021-02-10 MED ORDER — MORPHINE SULFATE (PF) 0.5 MG/ML IJ SOLN
INTRAMUSCULAR | Status: AC
  Filled 2021-02-10: qty 10

## 2021-02-10 MED ORDER — KETOROLAC TROMETHAMINE 30 MG/ML IJ SOLN
30.0000 mg | Freq: Once | INTRAMUSCULAR | Status: AC
  Administered 2021-02-10: 30 mg via INTRAVENOUS

## 2021-02-10 MED ORDER — ONDANSETRON HCL 4 MG/2ML IJ SOLN
INTRAMUSCULAR | Status: AC
  Filled 2021-02-10: qty 2

## 2021-02-10 SURGICAL SUPPLY — 35 items
BENZOIN TINCTURE PRP APPL 2/3 (GAUZE/BANDAGES/DRESSINGS) ×1 IMPLANT
CHLORAPREP W/TINT 26ML (MISCELLANEOUS) ×2 IMPLANT
CLAMP CORD UMBIL (MISCELLANEOUS) IMPLANT
CLOTH BEACON ORANGE TIMEOUT ST (SAFETY) ×2 IMPLANT
CLSR STERI-STRIP ANTIMIC 1/2X4 (GAUZE/BANDAGES/DRESSINGS) ×1 IMPLANT
DRAPE C SECTION CLR SCREEN (DRAPES) ×2 IMPLANT
DRSG OPSITE POSTOP 4X10 (GAUZE/BANDAGES/DRESSINGS) ×2 IMPLANT
EXTRACTOR VACUUM KIWI (MISCELLANEOUS) IMPLANT
GLOVE BIO SURGEON STRL SZ 6.5 (GLOVE) ×2 IMPLANT
GLOVE BIOGEL PI IND STRL 7.0 (GLOVE) ×2 IMPLANT
GLOVE BIOGEL PI INDICATOR 7.0 (GLOVE) ×2
GOWN STRL REUS W/TWL LRG LVL3 (GOWN DISPOSABLE) ×4 IMPLANT
KIT ABG SYR 3ML LUER SLIP (SYRINGE) IMPLANT
NDL HYPO 25X5/8 SAFETYGLIDE (NEEDLE) IMPLANT
NEEDLE HYPO 25X5/8 SAFETYGLIDE (NEEDLE) IMPLANT
NS IRRIG 1000ML POUR BTL (IV SOLUTION) ×2 IMPLANT
PACK C SECTION WH (CUSTOM PROCEDURE TRAY) ×2 IMPLANT
PAD OB MATERNITY 4.3X12.25 (PERSONAL CARE ITEMS) ×2 IMPLANT
RETRACTOR WND ALEXIS 25 LRG (MISCELLANEOUS) ×1 IMPLANT
RTRCTR C-SECT PINK 25CM LRG (MISCELLANEOUS) IMPLANT
RTRCTR WOUND ALEXIS 25CM LRG (MISCELLANEOUS) ×2
STRIP CLOSURE SKIN 1/2X4 (GAUZE/BANDAGES/DRESSINGS) IMPLANT
SUT CHROMIC 1 CTX 36 (SUTURE) ×4 IMPLANT
SUT PLAIN 0 NONE (SUTURE) IMPLANT
SUT PLAIN 2 0 XLH (SUTURE) ×2 IMPLANT
SUT VIC AB 0 CT1 27 (SUTURE) ×2
SUT VIC AB 0 CT1 27XBRD ANBCTR (SUTURE) ×2 IMPLANT
SUT VIC AB 2-0 CT1 27 (SUTURE) ×1
SUT VIC AB 2-0 CT1 TAPERPNT 27 (SUTURE) ×1 IMPLANT
SUT VIC AB 3-0 CT1 27 (SUTURE)
SUT VIC AB 3-0 CT1 TAPERPNT 27 (SUTURE) IMPLANT
SUT VIC AB 4-0 KS 27 (SUTURE) ×3 IMPLANT
TOWEL OR 17X24 6PK STRL BLUE (TOWEL DISPOSABLE) ×2 IMPLANT
TRAY FOLEY W/BAG SLVR 14FR LF (SET/KITS/TRAYS/PACK) ×2 IMPLANT
WATER STERILE IRR 1000ML POUR (IV SOLUTION) ×2 IMPLANT

## 2021-02-10 NOTE — Op Note (Signed)
Operative Note   SURGERY DATE: 02/10/2021  PRE-OP DIAGNOSIS:  *Pregnancy at 39/0 *Fetal bradycardia *History of prior cesarean section *Early labor (patient progressed from 1 to 4cm)  POST-OP DIAGNOSIS: Same. Thick meconium. Abruption  PROCEDURE: Repeat low transverse cesarean section via Claretha Cooper incision with double layer uterine closure  SURGEON:    Swayzee Bing, MD  ASSISTANT: None  ANESTHESIA: general  ESTIMATED BLOOD LOSS: for my operative part and approximately of blood intermixed with amniotic fluid  DRAINS: per anesthesia note  TOTAL IV FLUIDS: per anesthesia note   VTE PROPHYLAXIS: SCDs to bilateral lower extremities  ANTIBIOTICS: Two grams of Cefazolin were given intra operatively and azithromycin 500mg  IV x 1 to follow  SPECIMENS: placenta to pathology  COMPLICATIONS: none  INDICATIONS: Patient presented to [facility] triage in early labor and history of c-section and patient for repeat later tonight. In triage, baby had bradycardia for several minutes and I was called to assess since her primary OB was on the way. When I arrived, patient was on hands and knees and FHR was in the 80s and I ordered terbutaline. I told her and her husband we need to go to the OR now and if still bradycardia then she would need a stat under general and they were okay with this. In the OR, FHR still in the 70s so I proceed with a stat c-section  FINDINGS: No intra-abdominal adhesions were noted. Grossly normal uterus; tubes and ovaries not assessed by me. Thick meconium stained amniotic fluid with large amount of blood (?abruption), cephalic, not engaged in the pelvis,  female infant, weight 3420gm, APGARs 8/9, intact placenta.  PROCEDURE IN DETAIL: The patient was taken to the operating room where fetal heart tones were still in the 70s.  She was splash prepped with betadine and draped, and the anesthesia administered. A joel cohen skin incision was made with the scalpel and  carried through to the underlying layer of fascia. The fascia and the rectus muscles were then separated in the midline and the peritoneum was entered bluntly. The bladder blade was inserted and the vesicouterine peritoneum was identified.  A low transverse hysterotomy was made with the scalpel until the endometrial cavity was breached and the amniotic sac ruptured, yielding thick meconium stained amniotic fluid with dark, wine colored blood intermixed. This incision was extended bluntly and the infants head, shoulders and body were delivered atraumatically.The cord was clamped x 2 and cut, and the infant was handed to the awaiting pediatricians, after delayed cord clamping was not done.  The placenta was then gradually expressed from the uterus and then the uterus was exteriorized and cleared of all clots and debris. The hysterotomy was repaired with a running suture of 1-0 monocryl. A second imbricating layer of 1-0 monocryl suture was then placed. Several figure-of-eight sutures of 1-0 monocryl were added to achieve excellent hemostasis. At this point, Dr. 10/9 arrived and the case turned over to her. I told her that we were unable to do a count so an x-ray would be needed.  Mindi Slicker MD Attending Center for [facility])

## 2021-02-10 NOTE — H&P (Signed)
Yesenia Mora is a 67 y.[id number] female presented to MAU at 61 0/7wks gawith complaint of uterine contractions for several hours. Pt has a history of c/s x2 and was scheduled for a repeat c/s on 02/12/21. Pt was monitored and though she did not make any cervical change, due to her discomfort with contractions, decision was made to perform her repeat c/s today. Pt admitted to eating at 2pm ( bacon) thus, as not in active labor, c/s was scheduled for 10pm.   \I received a call  at approximately 6:38pm informing me of a fetal HR deceleration that had lasted without recovery I asked for a stat c/s to be called as I made my way in  On arrival, pt had beem taken to OR 2 by Dr Vergie Living and baby safely delivered at 6:47pm with a tentative diagnosis of placental abruption made.   I took over the case after Dr Vergie Living repaired the uterus - see OP note for full report  Pt had no significant prenatal complications. GBS neg OB History     Gravida  4   Para  3   Term  3   Preterm      AB  1   Living  3      SAB  1   IAB      Ectopic      Multiple  0   Live Births  3          Past Medical History:  Diagnosis Date   Anxiety    Asthma    sports induced   Depression    Hepatitis C    Vaginal Pap smear, abnormal    Past Surgical History:  Procedure Laterality Date   CESAREAN SECTION     CESAREAN SECTION N/A 05/19/2018   Procedure: CESAREAN SECTION;  Surgeon: Carrington Clamp, MD;  Location: [facility] LD ORS;  Service: Obstetrics;  Laterality: N/A;   WISDOM TOOTH EXTRACTION     Family History: family history includes Cancer in her maternal grandmother and paternal grandmother; Diabetes in her paternal grandmother. Social History:  reports that she has never smoked. She has never used smokeless tobacco. She reports that she does not currently use alcohol. She reports that she does not use drugs.     Maternal Diabetes: No Genetic Screening: Normal Maternal Ultrasounds/Referrals:  Normal Fetal Ultrasounds or other Referrals:  None Maternal Substance Abuse:  No Significant Maternal Medications:  None Significant Maternal Lab Results:  Group B Strep negative Other Comments:  None  Review of Systems  Constitutional:  Positive for activity change.  Gastrointestinal:  Positive for abdominal pain.  Genitourinary:  Negative for vaginal bleeding.  Maternal Medical History:  Reason for admission: Contractions.   Contractions: Onset was 3-5 hours ago.   Fetal activity: Perceived fetal activity is normal.   Last perceived fetal movement was within the past hour.   Prenatal complications: no prenatal complications Prenatal Complications - Diabetes: none.  Dilation: 4 Effacement (%): 100 Station: 0 Exam by:: K.Wilson,RN Blood pressure 118/83, pulse 89, temperature 98 F (36.7 C), temperature source Oral, resp. rate 20, height 5\' 2"  (1.575 m), weight 77 kg, SpO2 97 %, unknown if currently breastfeeding. Maternal Exam:  Uterine Assessment: Contraction strength is firm.  Contraction frequency is irregular.  Abdomen: Patient reports generalized tenderness.  Surgical scars: low transverse.   Fetal presentation: vertex Introitus: Normal vulva. Normal vagina.    Fetal Exam Fetal Monitor Review: Pattern: late decelerations.   Fetal State Assessment: Category III -  tracings are abnormal.  Physical Exam Constitutional:      Appearance: Normal appearance.  Abdominal:     Tenderness: There is generalized abdominal tenderness.  Genitourinary:    General: Normal vulva.  Musculoskeletal:        General: Normal range of motion.     Cervical back: Normal range of motion.  Skin:    General: Skin is warm and dry.     Capillary Refill: Capillary refill takes 2 to 3 seconds.    Prenatal labs: ABO, Rh: --/--/A POS (01/06 1429) Antibody: NEG (01/06 1429) Rubella: Immune (06/27 0000) RPR: NON REACTIVE (01/06 1425)  HBsAg: Negative (06/27 0000)  HIV: Non-reactive (06/27  0000)  GBS: Negative/-- (12/27 0000)   Assessment/Plan: [id number] [id number] female at 77 0/[redacted]wks gestation with cat 3 strip  - To OR for stat cesarean section x 3 - See OP note   Janean Sark Madgeline Rayo 02/10/2021, 7:47 PM

## 2021-02-10 NOTE — Op Note (Addendum)
Operative Note    Preoperative Diagnosis:  IUP at 39 0/7wks  2.   Cat 3 strip  3.   Previous c/s x 2   Postoperative Diagnosis: Same  4.  Suspected abruption    Procedure: Repeat cesarean section    Surgeon: Britt Bottom, DO ( took over from Foyil, C MD)   Anesthesia: General   Fluids: 2L EBL: 1L UOP: 44ml   Findings: Viable female infant in vertex position noted; APGARS pending  Meconium stained fluid noted    Specimen: Placenta to pathology    Procedure Note   The patient was taken to the operating room where fetal heart tones were still in the 70s.  She was splash prepped with betadine and draped, and the anesthesia administered. A joel cohen skin incision was made with the scalpel and carried through to the underlying layer of fascia. The fascia and the rectus muscles were then separated in the midline and the peritoneum was entered bluntly. The bladder blade was inserted and the vesicouterine peritoneum was identified.   A low transverse hysterotomy was made with the scalpel until the endometrial cavity was breached and the amniotic sac ruptured, yielding thick meconium stained amniotic fluid with dark, wine colored blood intermixed. This incision was extended bluntly and the infants head, shoulders and body were delivered atraumatically.The cord was clamped x 2 and cut, and the infant was handed to the awaiting pediatricians, after delayed cord clamping was not done.  The placenta was then gradually expressed from the uterus and then the uterus was exteriorized and cleared of all clots and debris. The hysterotomy was repaired with a running suture of 1-0 monocryl. A second imbricating layer of 1-0 monocryl suture was then placed. Several figure-of-eight sutures of 1-0 monocryl were added to achieve excellent hemostasis. At this point, Dr. Mindi Slicker arrived and the case turned over to her. I told her that we were unable to do a count so an x-ray would be needed.   Cornelia Copa MD Attending Center for [facility])   Dr Mindi Slicker DO continues   A count was done prior to starting after taking over from Dr Vergie Living.  Hemostasis was acknowledged The peritoneum was inspected but reapproximation was not feasible due to how separated it was thus fascia was reapproximated next after copious irrigation.   The XRAY team then arrived and performed a scan. A tentative bedside review noted no retained instruments or sponges ( final report from radiologist while still in OR confirmed this) thus, skin was reapproximated with 4-0 vicryl on a keith needle  Counts were confirmed once again and noted to be correct per initial count  Benzoin, steristrips and a honeycomb dressing were applied.   Pt was taken to recovery in stable condition

## 2021-02-10 NOTE — MAU Provider Note (Signed)
None      S: Ms. Yesenia Mora is a 36 y.o. [phone] at [redacted]w[redacted]d  who presents to MAU today complaining contractions q 3-5 minutes since earlier today. She denies vaginal bleeding. She denies LOF. She reports normal fetal movement.    O: BP 118/83 (BP Location: Right Arm)    Pulse 89    Temp 98 F (36.7 C) (Oral)    Resp 20    Ht 5\' 2"  (1.575 m)    Wt 77 kg    SpO2 97% Comment: room air   BMI 31.06 kg/m  GENERAL: Well-developed, well-nourished female in no acute distress.  HEAD: Normocephalic, atraumatic.  CHEST: Normal effort of breathing, regular heart rate ABDOMEN: Soft, nontender, gravid  Cervical exam:  Dilation: 1.5 Effacement (%): 90 Cervical Position: Middle Station: Ballotable Presentation: Undeterminable Exam by:: K.Wilson,RN   Fetal Monitoring: Baseline: 150 Variability: moderate Accelerations: 15x15 Decelerations: prolonged Contractions: Q 2-5 minutes   A/P: SIUP at [redacted]w[redacted]d  Active labor -patient uncomfortable with minor cervical change & continues to have regular contractions. Dr. [redacted]w[redacted]d notified & will plan to take for RCS at 10 pm per anesthesia  Fetal deceleration -while waiting for c/section, had prolonged deceleration with FHR down to 60s-80s for 9 minutes. No improvement with IV fluid bolus & maternal position changes. Dr. Mindi Slicker en route to hospital. Dr. Mindi Slicker called to bedside - patient taken for code cesarean  Vergie Living, NP 02/10/2021 6:42 PM

## 2021-02-10 NOTE — Anesthesia Preprocedure Evaluation (Addendum)
Anesthesia Evaluation  Patient identified by MRN, date of birth, ID band Patient awake    Reviewed: Allergy & Precautions, NPO status Preop documentation limited or incomplete due to emergent nature of procedure.  Airway Mallampati: II  TM Distance: >3 FB Neck ROM: Full    Dental no notable dental hx. (+) Teeth Intact, Dental Advisory Given   Pulmonary    Pulmonary exam normal breath sounds clear to auscultation       Cardiovascular Normal cardiovascular exam Rhythm:Regular Rate:Normal     Neuro/Psych    GI/Hepatic   Endo/Other    Renal/GU      Musculoskeletal   Abdominal (+) - obese,   Peds  Hematology   Anesthesia Other Findings   Reproductive/Obstetrics (+) Pregnancy                            Anesthesia Physical Anesthesia Plan  ASA: 3 and emergent  Anesthesia Plan: General   Post-op Pain Management: Dilaudid IV   Induction: Intravenous, Cricoid pressure planned and Rapid sequence  PONV Risk Score and Plan: 4 or greater and Treatment may vary due to age or medical condition, Ondansetron and Dexamethasone  Airway Management Planned: Oral ETT and Video Laryngoscope Planned  Additional Equipment: None  Intra-op Plan:   Post-operative Plan: Extubation in OR  Informed Consent: I have reviewed the patients History and Physical, chart, labs and discussed the procedure including the risks, benefits and alternatives for the proposed anesthesia with the patient or authorized representative who has indicated his/her understanding and acceptance.     Dental advisory given  Plan Discussed with: CRNA, Anesthesiologist and Surgeon  Anesthesia Plan Comments: (39 wk for Stat c/s under GA dfor FHT in th3e 70s)       Anesthesia Quick Evaluation

## 2021-02-10 NOTE — Anesthesia Postprocedure Evaluation (Signed)
Anesthesia Post Note  Patient: Mining engineer  Procedure(s) Performed: CESAREAN SECTION WITH BILATERAL Salpingectomy (Bilateral)     Patient location during evaluation: PACU Anesthesia Type: General Level of consciousness: awake and alert Pain management: pain level controlled Vital Signs Assessment: post-procedure vital signs reviewed and stable Respiratory status: spontaneous breathing, nonlabored ventilation, respiratory function stable and patient connected to nasal cannula oxygen Cardiovascular status: blood pressure returned to baseline and stable Postop Assessment: no apparent nausea or vomiting Anesthetic complications: no   No notable events documented.  Last Vitals:  Vitals:   02/10/21 2045 02/10/21 2052  BP: 128/81   Pulse: (!) 106 (!) 101  Resp: 18 19  Temp: 36.9 C   SpO2: 97% 96%    Last Pain:  Vitals:   02/10/21 2045  TempSrc:   PainSc: 0-No pain   Pain Goal:                Epidural/Spinal Function Cutaneous sensation: Normal sensation (02/10/21 2045), Patient able to flex knees: Yes (02/10/21 2045), Patient able to lift hips off bed: Yes (02/10/21 2045), Back pain beyond tenderness at insertion site: No (02/10/21 2045), Progressively worsening motor and/or sensory loss: No (02/10/21 2045), Bowel and/or bladder incontinence post epidural: No (02/10/21 2045)  Barnet Glasgow

## 2021-02-10 NOTE — Lactation Note (Signed)
This note was copied from a baby's chart. Lactation Consultation Note  Patient Name: Yesenia Mora Today's Date: 02/10/2021 Age:35 hours  Attempted LC visit. Per RN, Vicenta Aly, mother prefers [facility] initial visit tomorrow 02/11/2021.   Feeding Mother's Current Feeding Choice: Breast Milk  Consult Status Consult Status:  (Per mother, delay initial visit.) Date: 02/11/21 Follow-up type: In-patient    Thomson Herbers A Higuera Ancidey 02/10/2021, 9:57 PM

## 2021-02-10 NOTE — Anesthesia Procedure Notes (Signed)
Procedure Name: Intubation Date/Time: 02/10/2021 6:46 PM Performed by: Elenore Paddy, CRNA Pre-anesthesia Checklist: Patient identified, Patient being monitored, Timeout performed, Emergency Drugs available and Suction available Patient Re-evaluated:Patient Re-evaluated prior to induction Oxygen Delivery Method: Circle System Utilized Preoxygenation: Pre-oxygenation with 100% oxygen Induction Type: IV induction Ventilation: Mask ventilation without difficulty Laryngoscope Size: Mac and 3 Grade View: Grade II Tube type: Oral Tube size: 7.0 mm Number of attempts: 1 Airway Equipment and Method: stylet and Video-laryngoscopy Placement Confirmation: ETT inserted through vocal cords under direct vision, positive ETCO2 and breath sounds checked- equal and bilateral Secured at: 21 cm Tube secured with: Tape Dental Injury: Teeth and Oropharynx as per pre-operative assessment

## 2021-02-10 NOTE — MAU Note (Signed)
Yesenia Mora is a 36 y.o. at [redacted]w[redacted]d here in MAU reporting: started contracting today. They are now every 1-2 minutes. Saw some spotting this morning but none now. No LOF. +FM  Onset of complaint: today  Pain score: 8/10  Vitals:   02/10/21 1549  BP: 118/83  Pulse: 89  Resp: 20  Temp: 98 F (36.7 C)  SpO2: 97%     FHT:142  Lab orders placed from triage: none

## 2021-02-10 NOTE — Transfer of Care (Signed)
Immediate Anesthesia Transfer of Care Note  Patient: Yesenia Mora  Procedure(s) Performed: CESAREAN SECTION WITH BILATERAL Salpingectomy (Bilateral)  Patient Location: PACU  Anesthesia Type:General  Level of Consciousness: awake  Airway & Oxygen Therapy: Patient Spontanous Breathing  Post-op Assessment: Report given to RN and Post -op Vital signs reviewed and stable  Post vital signs: Reviewed and stable  Last Vitals:  Vitals Value Taken Time  BP 129/66 02/10/21 2015  Temp    Pulse 118 02/10/21 2016  Resp 11 02/10/21 2016  SpO2 99 % 02/10/21 2016  Vitals shown include unvalidated device data.  Last Pain:  Vitals:   02/10/21 1549  TempSrc: Oral  PainSc: 8          Complications: No notable events documented.

## 2021-02-11 ENCOUNTER — Encounter (HOSPITAL_COMMUNITY): Payer: Self-pay | Admitting: Obstetrics and Gynecology

## 2021-02-11 LAB — CBC
HCT: 24.8 % — ABNORMAL LOW (ref 36.0–46.0)
Hemoglobin: 7.9 g/dL — ABNORMAL LOW (ref 12.0–15.0)
MCH: 26.9 pg (ref 26.0–34.0)
MCHC: 31.9 g/dL (ref 30.0–36.0)
MCV: 84.4 fL (ref 80.0–100.0)
Platelets: 203 10*3/uL (ref 150–400)
RBC: 2.94 MIL/uL — ABNORMAL LOW (ref 3.87–5.11)
RDW: 14.6 % (ref 11.5–15.5)
WBC: 13.2 10*3/uL — ABNORMAL HIGH (ref 4.0–10.5)
nRBC: 0 % (ref 0.0–0.2)

## 2021-02-11 MED ORDER — SODIUM CHLORIDE 0.9 % IV SOLN
3.0000 g | Freq: Four times a day (QID) | INTRAVENOUS | Status: DC
  Administered 2021-02-11 (×2): 3 g via INTRAVENOUS
  Filled 2021-02-11 (×3): qty 8

## 2021-02-11 NOTE — Lactation Note (Signed)
This note was copied from a baby's chart. Lactation Consultation Note  Patient Name: Yesenia Mora Today's Date: 02/11/2021 Reason for consult: Initial assessment;Term Age:35 hours   P3 mom.  Basic education provided to mom.  Mom stated she was unsure about feeding plan and that she would have to return to work in 8 weeks and may not have the time to pump at work.  Assisted mom with latch.  Infant attempted and was on and off for about 5 minutes and no longer showed interest.  Parents encouraged to continue to latch if mom wants to maintain her supply, as well as sts, hand expression as needed, and outpatient resources provided.  Mom also given encouragement to pump if she wanted to maintain supply.  Mom encouraged to call for assistance with pump set up and/or latching as needed.    Maternal Data Has patient been taught Hand Expression?: Yes Does the patient have breastfeeding experience prior to this delivery?: Yes How long did the patient breastfeed?: few months with first child, and a little less for second child due to lack of weight gain  Feeding Mother's Current Feeding Choice: Breast Milk and Formula  LATCH Score Latch: Repeated attempts needed to sustain latch, nipple held in mouth throughout feeding, stimulation needed to elicit sucking reflex.  Audible Swallowing: None  Type of Nipple: Everted at rest and after stimulation  Comfort (Breast/Nipple): Soft / non-tender  Hold (Positioning): Assistance needed to correctly position infant at breast and maintain latch.  LATCH Score: 6   Lactation Tools Discussed/Used    Interventions Interventions: Assisted with latch;Skin to skin;Breast massage;Hand express;Support pillows;Position options;Expressed milk;LC Services brochure  Discharge    Consult Status Consult Status: Follow-up Date: 02/12/21 Follow-up type: In-patient    Danne Harbor 02/11/2021, 7:43 PM

## 2021-02-11 NOTE — Lactation Note (Signed)
This note was copied from a baby's chart. Lactation Consultation Note  Patient Name: Yesenia Mora Today's Date: 02/11/2021   Age:35 hours  LC visit attempted, but room was dark & quiet. LC to f/u later.   Lurline Hare [facility] 02/11/2021, 8:14 AM

## 2021-02-11 NOTE — Progress Notes (Signed)
Subjective: Postpartum Day 1: Cesarean Delivery Patient reports tolerating PO and + flatus.  Lochia mild. Foley just removed - has not voided yet without it. She denies lightheadedness, fever, chills, SOB or HA. Pain well controlled. Breast and bottlefeeding with no issues. Had some questions about surgery   Objective: Vital signs in last 24 hours: Temp:  [97.6 F (36.4 C)-98.4 F (36.9 C)] 97.6 F (36.4 C) (01/08 0750) Pulse Rate:  [81-120] 81 (01/08 0750) Resp:  [12-20] 16 (01/08 0750) BP: (106-134)/(63-83) 111/68 (01/08 0750) SpO2:  [94 %-98 %] 97 % (01/08 0750) Weight:  [77 kg] 77 kg (01/07 1549)  Physical Exam:  General: alert, cooperative, and no distress Lochia: appropriate Uterine Fundus: firm Incision: no significant drainage DVT Evaluation: No evidence of DVT seen on physical exam.  Recent Labs    02/10/21 2022 02/11/21 0516  HGB 9.2* 7.9*  HCT 30.4* 24.8*    Assessment/Plan: Status post Cesarean section. Doing well postoperatively.  Continue current care. Possible discharge to home tomorrow  Asymptomatic anemia   Venetia Night Venna Berberich 02/11/2021, 11:10 AM

## 2021-02-11 NOTE — Progress Notes (Signed)
MOB was referred for history of depression/anxiety. * Referral screened out by Clinical Social Worker because none of the following criteria appear to apply:  ~ History of anxiety/depression during this pregnancy, or of post-partum depression following prior delivery. ~ Diagnosis of anxiety and/or depression within last 3 years OR * MOB's symptoms currently being treated with medication and/or therapy. Per chart, MOB's symptoms are currently being treated with Zoloft.   Please contact the Clinical Social Worker if needs arise, by MOB request, or if MOB scores greater than 9 or yes to question 10 on Edinburgh Postpartum Depression Screen.   Hudson Majkowski, MSW, LCSW-A Clinical Social Worker- Weekends [phone]  

## 2021-02-12 ENCOUNTER — Inpatient Hospital Stay (HOSPITAL_COMMUNITY)
Admission: RE | Admit: 2021-02-12 | Payer: BC Managed Care – PPO | Source: Home / Self Care | Admitting: Obstetrics and Gynecology

## 2021-02-12 HISTORY — DX: Depression, unspecified: F32.A

## 2021-02-12 MED ORDER — OXYCODONE HCL 5 MG PO TABS: 5.0000 mg | ORAL_TABLET | Freq: Four times a day (QID) | ORAL | 0 refills | Status: AC | PRN

## 2021-02-12 NOTE — Discharge Summary (Signed)
Postpartum Discharge Summary  Date of Service updated 02/12/21     Patient Name: Yesenia Mora DOB: [birth year] MRN: [id number]  Date of admission: 02/10/2021 Delivery date:02/10/2021  Delivering provider: Plessis Bing  Date of discharge: 02/12/2021  Admitting diagnosis: Postpartum care following cesarean delivery [Z39.2] Intrauterine pregnancy: [redacted]w[redacted]d     Secondary diagnosis:  Principal Problem:   Postpartum care following cesarean delivery Active Problems:   Status post repeat low transverse cesarean section  Additional problems: NOne    Discharge diagnosis: Term Pregnancy Delivered                                              Post partum procedures: none Augmentation: N/A Complications: Placental Abruption and Hemorrhage>1034mL  Hospital course: Onset of Labor With Unplanned C/S   36 y.o. yo [id number] at [redacted]w[redacted]d was admitted in Latent Labor on 02/10/2021. Given her history of prior cesarean section x 2, plan was for repeat cesarean section for early labor.  While awaiting NPO status in MAU, there was a terminal bradycardia and stat cesarean section under general anesthesia was performed. Concern for placental abruption.  Total EBL was approximately 1500 mL during cesarean section. The patient went for cesarean section due to Non-Reassuring FHR. Delivery details as follows: Membrane Rupture Time/Date: 6:46 PM ,02/10/2021   Delivery Method:C-Section, Low Transverse  Details of operation can be found in separate operative note. Patient had an uncomplicated postpartum course.  She is ambulating,tolerating a regular diet, passing flatus, and urinating well.  ABLA due to [facility] at the time of cesarean delivery.  Hemoglobin was 7.9.  Patient was asymptomatic and discharged on PO iron. Patient is discharged home in stable condition 02/12/21.  Newborn Data: Birth date:02/10/2021  Birth time:6:47 PM  Gender:Female  Living status:Living  Apgars:8 ,9  Weight:3420 g    Transfusion:No  Physical exam   Vitals:   02/11/21 0750 02/11/21 1100 02/11/21 2107 02/12/21 0518  BP: 111/68 109/68 99/62 112/66  Pulse: 81 86 71 78  Resp: 16 18 18 18   Temp: 97.6 F (36.4 C) 97.6 F (36.4 C) 97.9 F (36.6 C) 97.9 F (36.6 C)  TempSrc: Oral Oral Oral Oral  SpO2: 97% 99% 99% 97%  Weight:      Height:       General: alert, cooperative, and no distress Lochia: appropriate Uterine Fundus: firm Incision: Healing well with no significant drainage DVT Evaluation: Negative Homan's sign. Labs: Lab Results  Component Value Date   WBC 13.2 (H) 02/11/2021   HGB 7.9 (L) 02/11/2021   HCT 24.8 (L) 02/11/2021   MCV 84.4 02/11/2021   PLT 203 02/11/2021   No flowsheet data found. Edinburgh Score: Edinburgh Postnatal Depression Scale Screening Tool 02/12/2021  I have been able to laugh and see the funny side of things. (No Data)  I have looked forward with enjoyment to things. -  I have blamed myself unnecessarily when things went wrong. -  I have been anxious or worried for no good reason. -  I have felt scared or panicky for no good reason. -  Things have been getting on top of me. -  I have been so unhappy that I have had difficulty sleeping. -  I have felt sad or miserable. -  I have been so unhappy that I have been crying. -  The thought of harming myself has occurred to  me. [name] Postnatal Depression Scale Total -      After visit meds:  Allergies as of 02/12/2021   No Known Allergies      Medication List     TAKE these medications    acetaminophen 500 MG tablet Commonly known as: TYLENOL Take 500-1,000 mg by mouth every 6 (six) hours as needed (for pain.).   albuterol 108 (90 Base) MCG/ACT inhaler Commonly known as: VENTOLIN HFA Inhale 1-2 puffs into the lungs every 6 (six) hours as needed (exercise induced asthma).   oxyCODONE 5 MG immediate release tablet Commonly known as: Oxy IR/ROXICODONE Take 1 tablet (5 mg total) by mouth every 6 (six) hours as needed for  moderate pain.   prenatal multivitamin Tabs tablet Take 1 tablet by mouth at bedtime.   sertraline 100 MG tablet Commonly known as: ZOLOFT Take 100 mg by mouth at bedtime.               Discharge Care Instructions  (From admission, onward)           Start     Ordered   02/12/21 0000  No dressing needed        02/12/21 1110             Discharge home in stable condition Infant Feeding: Breast Infant Disposition:home with mother Discharge instruction: per After Visit Summary and Postpartum booklet. Activity: Advance as tolerated. Pelvic rest for 6 weeks.  Diet: routine diet Anticipated Birth Control: Unsure Postpartum Appointment:4 weeks Additional Postpartum F/U:  n/a Future Appointments:No future appointments. Follow up Visit:  Follow-up Information     Waynard Reeds, MD Follow up in 4 week(s).   Specialty: Obstetrics and Gynecology Why: postpartum visit Contact information: [address] [place] [address] [phone]                     02/12/2021 [facility] Lizabeth Leyden, MD

## 2021-02-12 NOTE — Discharge Instructions (Signed)
You may take ibuprofen 600 mg and acetaminophen (tylenol) 1000 mg every 6 hours for pain Add oxycodone 1/2-1 tab every 6 hours as needed for severe pain  In one week, please add an over the counter iron tablet every other day for anemia (low blood count)

## 2021-02-12 NOTE — Progress Notes (Signed)
Patient is doing well.  She is tolerating PO, ambulating, voiding.  Pain is controlled.  Lochia is appropriate  Vitals:   02/11/21 0750 02/11/21 1100 02/11/21 2107 02/12/21 0518  BP: 111/68 109/68 99/62 112/66  Pulse: 81 86 71 78  Resp: 16 18 18 18   Temp: 97.6 F (36.4 C) 97.6 F (36.4 C) 97.9 F (36.6 C) 97.9 F (36.6 C)  TempSrc: Oral Oral Oral Oral  SpO2: 97% 99% 99% 97%  Weight:      Height:        NAD Abdomen:  soft, appropriate tenderness, incisions intact and without erythema  ext:    Symmetric, no edema bilaterally  Lab Results  Component Value Date   WBC 13.2 (H) 02/11/2021   HGB 7.9 (L) 02/11/2021   HCT 24.8 (L) 02/11/2021   MCV 84.4 02/11/2021   PLT 203 02/11/2021    --/--/A POS (01/06 1429)/RImmune  A/P    36 y.o. [id number] POD 2 s/p emergent RCS for fetal bradycardia Routine post op and postpartum care.   Meeting all goals--discharge to home ABLA--will d/c on PO iron

## 2021-02-24 ENCOUNTER — Telehealth (HOSPITAL_COMMUNITY): Payer: Self-pay

## 2021-02-24 NOTE — Telephone Encounter (Signed)
No answer. Left message to return nurse call.  Sharyn Lull [facility] 02/24/2021,1457

## 2022-08-07 IMAGING — DX DG ABD PORTABLE 1V
1 series · 2 of 2 positions shown · non-contrast
Comparison: None.

CLINICAL DATA: Emergency C-section

EXAM:
PORTABLE ABDOMEN - 1 VIEW

[Series 1: abdomen · 0.14mm/px · 2 of 2 slices shown]
[im 1/2]
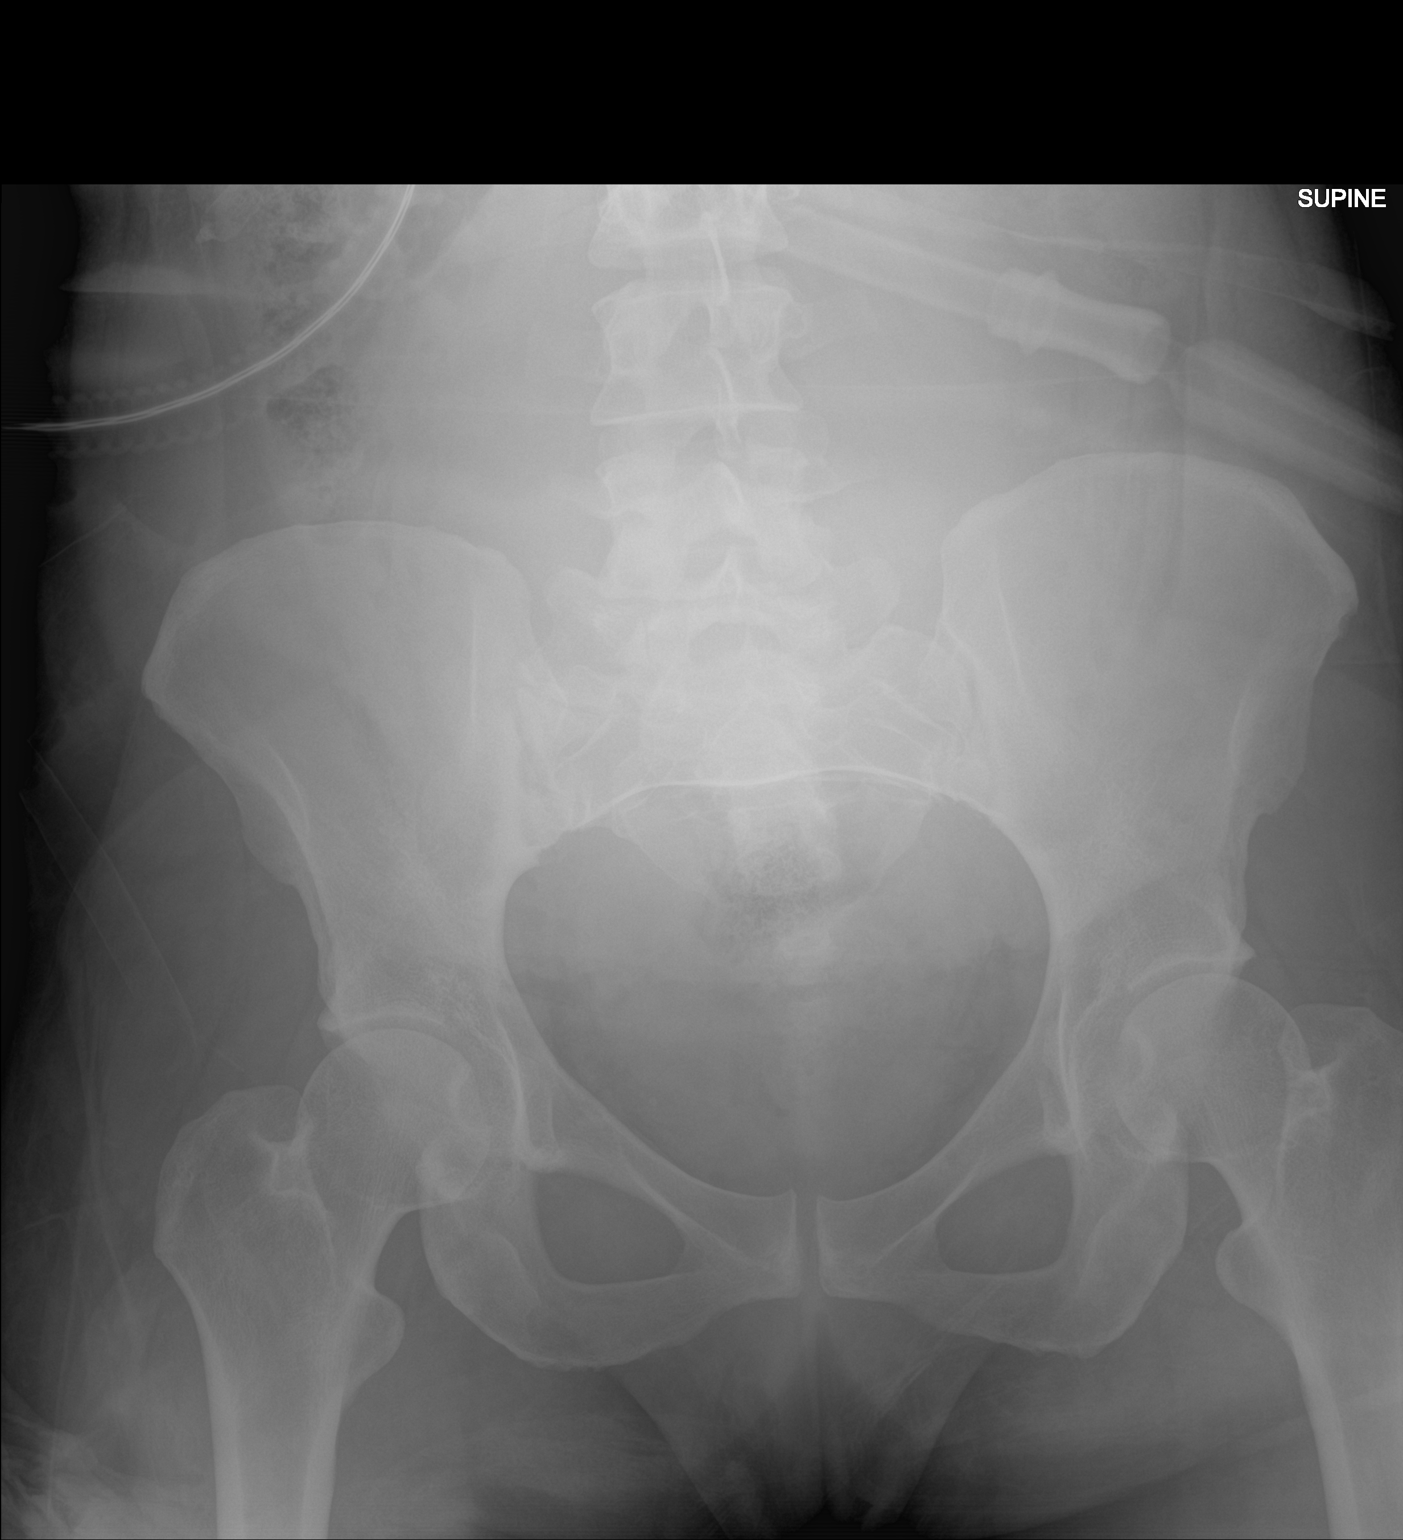
[im 2/2]
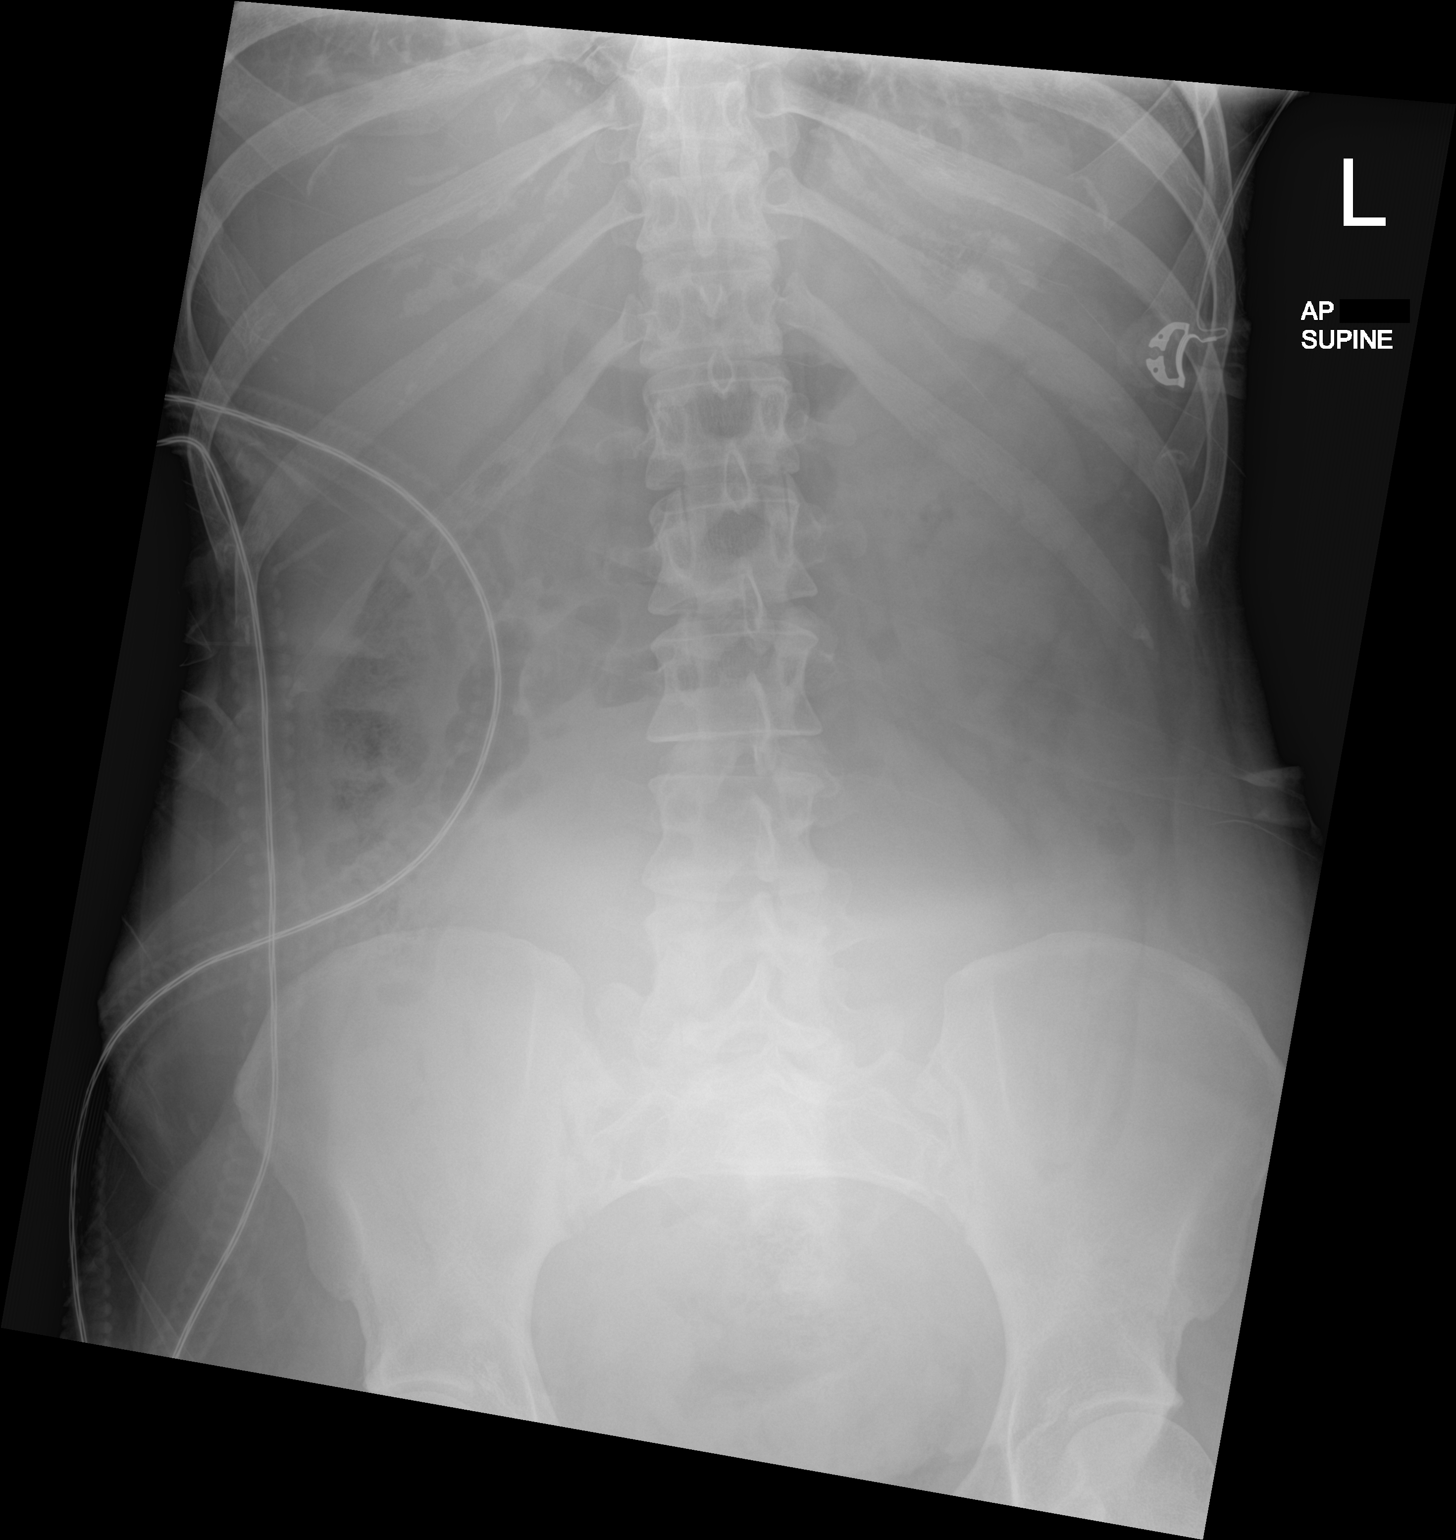

[2 of 2 positions shown; findings below may reference images not displayed]

FINDINGS: The bowel gas pattern is normal. No radio-opaque calculi or other
significant radiographic abnormality are seen. No unexpected foreign
bodies are seen. Ovoid soft tissue opacity in the pelvis likely
corresponds to the postpartum uterus
IMPRESSION: Negative.  No unexpected foreign bodies are identified.

Critical Value/emergent results were called by telephone at the time
of interpretation on 02/10/2021 at [DATE] to provider [HOSPITAL] [place]
of [facility] , who verbally acknowledged these results.

## 2023-04-29 ENCOUNTER — Telehealth (HOSPITAL_BASED_OUTPATIENT_CLINIC_OR_DEPARTMENT_OTHER): Payer: Self-pay | Admitting: Emergency Medicine

## 2023-04-29 NOTE — Telephone Encounter (Signed)
 Patient called with questions about her recent visit. Upon chart review [facility] visited [facility] instead of our practice.   I offered to provide phone number to their office but patient declined and stated she would call them.
